# Patient Record
Sex: Female | Born: 1935 | Race: White | Hispanic: No
Health system: Southern US, Community
[De-identification: ages and names within clinical notes are randomized; demographics above are authoritative.]

---

## 1998-02-14 ENCOUNTER — Encounter: Admission: RE | Admit: 1998-02-14 | Discharge: 1998-05-15 | Payer: Self-pay | Admitting: Internal Medicine

## 1998-03-31 ENCOUNTER — Ambulatory Visit (HOSPITAL_COMMUNITY): Admission: RE | Admit: 1998-03-31 | Discharge: 1998-03-31 | Payer: Self-pay | Admitting: *Deleted

## 1999-09-11 ENCOUNTER — Encounter: Admission: RE | Admit: 1999-09-11 | Discharge: 1999-09-11 | Payer: Self-pay | Admitting: *Deleted

## 2000-09-15 ENCOUNTER — Encounter: Admission: RE | Admit: 2000-09-15 | Discharge: 2000-09-15 | Payer: Self-pay | Admitting: *Deleted

## 2000-09-29 ENCOUNTER — Encounter: Payer: Self-pay | Admitting: *Deleted

## 2000-09-29 ENCOUNTER — Ambulatory Visit (HOSPITAL_COMMUNITY): Admission: RE | Admit: 2000-09-29 | Discharge: 2000-09-29 | Payer: Self-pay | Admitting: *Deleted

## 2000-10-17 ENCOUNTER — Encounter: Payer: Self-pay | Admitting: *Deleted

## 2000-10-17 ENCOUNTER — Ambulatory Visit (HOSPITAL_COMMUNITY): Admission: RE | Admit: 2000-10-17 | Discharge: 2000-10-17 | Payer: Self-pay | Admitting: *Deleted

## 2000-10-17 ENCOUNTER — Encounter (INDEPENDENT_AMBULATORY_CARE_PROVIDER_SITE_OTHER): Payer: Self-pay

## 2001-01-19 ENCOUNTER — Other Ambulatory Visit: Admission: RE | Admit: 2001-01-19 | Discharge: 2001-01-19 | Payer: Self-pay

## 2001-08-28 ENCOUNTER — Ambulatory Visit (HOSPITAL_COMMUNITY): Admission: RE | Admit: 2001-08-28 | Discharge: 2001-08-28 | Payer: Self-pay | Admitting: *Deleted

## 2001-08-28 ENCOUNTER — Encounter (INDEPENDENT_AMBULATORY_CARE_PROVIDER_SITE_OTHER): Payer: Self-pay | Admitting: Specialist

## 2001-10-16 ENCOUNTER — Encounter: Payer: Self-pay | Admitting: Emergency Medicine

## 2001-10-16 ENCOUNTER — Inpatient Hospital Stay (HOSPITAL_COMMUNITY): Admission: EM | Admit: 2001-10-16 | Discharge: 2001-11-09 | Payer: Self-pay | Admitting: Emergency Medicine

## 2001-10-20 ENCOUNTER — Encounter: Payer: Self-pay | Admitting: Orthopedic Surgery

## 2001-10-21 ENCOUNTER — Encounter: Payer: Self-pay | Admitting: Critical Care Medicine

## 2001-10-22 ENCOUNTER — Encounter: Payer: Self-pay | Admitting: Pulmonary Disease

## 2001-10-23 ENCOUNTER — Encounter: Payer: Self-pay | Admitting: Pulmonary Disease

## 2001-10-27 ENCOUNTER — Encounter: Payer: Self-pay | Admitting: Orthopedic Surgery

## 2001-11-09 ENCOUNTER — Inpatient Hospital Stay
Admission: RE | Admit: 2001-11-09 | Discharge: 2001-12-02 | Payer: Self-pay | Admitting: Physical Medicine & Rehabilitation

## 2002-05-12 ENCOUNTER — Inpatient Hospital Stay (HOSPITAL_COMMUNITY): Admission: RE | Admit: 2002-05-12 | Discharge: 2002-05-17 | Payer: Self-pay | Admitting: Orthopaedic Surgery

## 2002-05-17 ENCOUNTER — Inpatient Hospital Stay (HOSPITAL_COMMUNITY)
Admission: RE | Admit: 2002-05-17 | Discharge: 2002-05-24 | Payer: Self-pay | Admitting: Physical Medicine & Rehabilitation

## 2002-09-10 ENCOUNTER — Inpatient Hospital Stay (HOSPITAL_COMMUNITY): Admission: RE | Admit: 2002-09-10 | Discharge: 2002-09-14 | Payer: Self-pay | Admitting: Orthopaedic Surgery

## 2003-05-06 ENCOUNTER — Encounter: Payer: Self-pay | Admitting: Orthopedic Surgery

## 2003-05-06 ENCOUNTER — Inpatient Hospital Stay (HOSPITAL_COMMUNITY): Admission: AD | Admit: 2003-05-06 | Discharge: 2003-05-10 | Payer: Self-pay | Admitting: Orthopedic Surgery

## 2003-05-07 ENCOUNTER — Encounter: Payer: Self-pay | Admitting: Orthopedic Surgery

## 2003-05-10 ENCOUNTER — Inpatient Hospital Stay
Admission: RE | Admit: 2003-05-10 | Discharge: 2003-05-18 | Payer: Self-pay | Admitting: Physical Medicine & Rehabilitation

## 2003-05-18 ENCOUNTER — Encounter: Payer: Self-pay | Admitting: Physical Medicine & Rehabilitation

## 2003-05-18 ENCOUNTER — Ambulatory Visit (HOSPITAL_COMMUNITY)
Admission: RE | Admit: 2003-05-18 | Discharge: 2003-05-18 | Payer: Self-pay | Admitting: Physical Medicine & Rehabilitation

## 2004-01-06 ENCOUNTER — Encounter (INDEPENDENT_AMBULATORY_CARE_PROVIDER_SITE_OTHER): Payer: Self-pay | Admitting: Specialist

## 2004-01-06 ENCOUNTER — Ambulatory Visit (HOSPITAL_COMMUNITY): Admission: RE | Admit: 2004-01-06 | Discharge: 2004-01-06 | Payer: Self-pay | Admitting: *Deleted

## 2004-03-08 ENCOUNTER — Ambulatory Visit (HOSPITAL_COMMUNITY): Admission: RE | Admit: 2004-03-08 | Discharge: 2004-03-09 | Payer: Self-pay | Admitting: *Deleted

## 2004-06-19 ENCOUNTER — Inpatient Hospital Stay (HOSPITAL_COMMUNITY): Admission: RE | Admit: 2004-06-19 | Discharge: 2004-06-22 | Payer: Self-pay | Admitting: Orthopedic Surgery

## 2004-06-22 ENCOUNTER — Ambulatory Visit: Payer: Self-pay | Admitting: Physical Medicine & Rehabilitation

## 2004-06-22 ENCOUNTER — Inpatient Hospital Stay
Admission: RE | Admit: 2004-06-22 | Discharge: 2004-06-29 | Payer: Self-pay | Admitting: Physical Medicine & Rehabilitation

## 2004-09-04 ENCOUNTER — Encounter: Admission: RE | Admit: 2004-09-04 | Discharge: 2004-12-03 | Payer: Self-pay | Admitting: Orthopedic Surgery

## 2004-11-30 ENCOUNTER — Encounter (INDEPENDENT_AMBULATORY_CARE_PROVIDER_SITE_OTHER): Payer: Self-pay | Admitting: Specialist

## 2004-11-30 ENCOUNTER — Ambulatory Visit (HOSPITAL_COMMUNITY): Admission: RE | Admit: 2004-11-30 | Discharge: 2004-11-30 | Payer: Self-pay | Admitting: *Deleted

## 2005-02-23 ENCOUNTER — Emergency Department (HOSPITAL_COMMUNITY): Admission: EM | Admit: 2005-02-23 | Discharge: 2005-02-23 | Payer: Self-pay | Admitting: Emergency Medicine

## 2005-03-07 ENCOUNTER — Encounter: Admission: RE | Admit: 2005-03-07 | Discharge: 2005-03-07 | Payer: Self-pay | Admitting: Orthopaedic Surgery

## 2005-03-08 ENCOUNTER — Encounter: Admission: RE | Admit: 2005-03-08 | Discharge: 2005-03-08 | Payer: Self-pay | Admitting: Radiology

## 2005-03-11 ENCOUNTER — Encounter: Admission: RE | Admit: 2005-03-11 | Discharge: 2005-03-11 | Payer: Self-pay | Admitting: Orthopaedic Surgery

## 2005-06-20 ENCOUNTER — Encounter: Admission: RE | Admit: 2005-06-20 | Discharge: 2005-06-20 | Payer: Self-pay | Admitting: Orthopaedic Surgery

## 2005-06-27 ENCOUNTER — Encounter: Admission: RE | Admit: 2005-06-27 | Discharge: 2005-06-27 | Payer: Self-pay | Admitting: Orthopaedic Surgery

## 2005-07-12 ENCOUNTER — Encounter: Admission: RE | Admit: 2005-07-12 | Discharge: 2005-07-12 | Payer: Self-pay | Admitting: Orthopaedic Surgery

## 2005-07-30 ENCOUNTER — Encounter: Admission: RE | Admit: 2005-07-30 | Discharge: 2005-07-30 | Payer: Self-pay | Admitting: *Deleted

## 2005-09-13 ENCOUNTER — Inpatient Hospital Stay (HOSPITAL_COMMUNITY): Admission: RE | Admit: 2005-09-13 | Discharge: 2005-09-15 | Payer: Self-pay | Admitting: Orthopaedic Surgery

## 2005-11-05 ENCOUNTER — Ambulatory Visit (HOSPITAL_COMMUNITY): Admission: RE | Admit: 2005-11-05 | Discharge: 2005-11-05 | Payer: Self-pay | Admitting: *Deleted

## 2005-12-11 ENCOUNTER — Encounter (INDEPENDENT_AMBULATORY_CARE_PROVIDER_SITE_OTHER): Payer: Self-pay | Admitting: *Deleted

## 2005-12-11 ENCOUNTER — Ambulatory Visit (HOSPITAL_COMMUNITY): Admission: RE | Admit: 2005-12-11 | Discharge: 2005-12-11 | Payer: Self-pay | Admitting: *Deleted

## 2007-04-11 ENCOUNTER — Emergency Department (HOSPITAL_COMMUNITY): Admission: EM | Admit: 2007-04-11 | Discharge: 2007-04-11 | Payer: Self-pay | Admitting: Emergency Medicine

## 2008-02-23 ENCOUNTER — Ambulatory Visit (HOSPITAL_COMMUNITY): Admission: RE | Admit: 2008-02-23 | Discharge: 2008-02-23 | Payer: Self-pay | Admitting: Orthopaedic Surgery

## 2008-02-23 ENCOUNTER — Encounter (INDEPENDENT_AMBULATORY_CARE_PROVIDER_SITE_OTHER): Payer: Self-pay | Admitting: Orthopaedic Surgery

## 2008-02-23 ENCOUNTER — Ambulatory Visit: Payer: Self-pay | Admitting: Vascular Surgery

## 2008-05-26 ENCOUNTER — Inpatient Hospital Stay (HOSPITAL_COMMUNITY): Admission: EM | Admit: 2008-05-26 | Discharge: 2008-05-28 | Payer: Self-pay | Admitting: Emergency Medicine

## 2008-05-27 ENCOUNTER — Ambulatory Visit: Payer: Self-pay | Admitting: Vascular Surgery

## 2008-05-27 ENCOUNTER — Encounter (INDEPENDENT_AMBULATORY_CARE_PROVIDER_SITE_OTHER): Payer: Self-pay | Admitting: *Deleted

## 2008-05-30 ENCOUNTER — Encounter: Admission: RE | Admit: 2008-05-30 | Discharge: 2008-08-28 | Payer: Self-pay | Admitting: Internal Medicine

## 2008-06-13 ENCOUNTER — Inpatient Hospital Stay (HOSPITAL_COMMUNITY): Admission: EM | Admit: 2008-06-13 | Discharge: 2008-06-17 | Payer: Self-pay | Admitting: Emergency Medicine

## 2008-08-29 ENCOUNTER — Encounter: Admission: RE | Admit: 2008-08-29 | Discharge: 2008-10-25 | Payer: Self-pay | Admitting: Internal Medicine

## 2009-10-12 ENCOUNTER — Inpatient Hospital Stay (HOSPITAL_COMMUNITY): Admission: EM | Admit: 2009-10-12 | Discharge: 2009-10-15 | Payer: Self-pay | Admitting: Emergency Medicine

## 2010-02-14 ENCOUNTER — Encounter: Admission: RE | Admit: 2010-02-14 | Discharge: 2010-02-14 | Payer: Self-pay | Admitting: Neurology

## 2010-11-18 ENCOUNTER — Encounter: Payer: Self-pay | Admitting: Orthopaedic Surgery

## 2011-01-28 LAB — HEPATIC FUNCTION PANEL
ALT: 15 U/L (ref 0–35)
AST: 16 U/L (ref 0–37)
Alkaline Phosphatase: 65 U/L (ref 39–117)
Bilirubin, Direct: 0.1 mg/dL (ref 0.0–0.3)
Indirect Bilirubin: 0.6 mg/dL (ref 0.3–0.9)
Total Protein: 6 g/dL (ref 6.0–8.3)

## 2011-01-28 LAB — CBC
HCT: 42.1 % (ref 36.0–46.0)
HCT: 43 % (ref 36.0–46.0)
MCHC: 34.5 g/dL (ref 30.0–36.0)
MCV: 98.7 fL (ref 78.0–100.0)
MCV: 99.7 fL (ref 78.0–100.0)
Platelets: 148 10*3/uL — ABNORMAL LOW (ref 150–400)
Platelets: 152 10*3/uL (ref 150–400)
RBC: 4.22 MIL/uL (ref 3.87–5.11)
RDW: 13.1 % (ref 11.5–15.5)
WBC: 8.8 10*3/uL (ref 4.0–10.5)

## 2011-01-28 LAB — GLUCOSE, CAPILLARY
Glucose-Capillary: 125 mg/dL — ABNORMAL HIGH (ref 70–99)
Glucose-Capillary: 131 mg/dL — ABNORMAL HIGH (ref 70–99)
Glucose-Capillary: 158 mg/dL — ABNORMAL HIGH (ref 70–99)
Glucose-Capillary: 82 mg/dL (ref 70–99)
Glucose-Capillary: 86 mg/dL (ref 70–99)
Glucose-Capillary: 94 mg/dL (ref 70–99)

## 2011-01-28 LAB — BASIC METABOLIC PANEL
BUN: 6 mg/dL (ref 6–23)
CO2: 28 mEq/L (ref 19–32)
CO2: 29 mEq/L (ref 19–32)
CO2: 29 mEq/L (ref 19–32)
Calcium: 8.2 mg/dL — ABNORMAL LOW (ref 8.4–10.5)
Calcium: 8.4 mg/dL (ref 8.4–10.5)
Chloride: 104 mEq/L (ref 96–112)
Chloride: 104 mEq/L (ref 96–112)
Creatinine, Ser: 0.79 mg/dL (ref 0.4–1.2)
Creatinine, Ser: 0.86 mg/dL (ref 0.4–1.2)
GFR calc Af Amer: >60 mL/min (ref >60)
Glucose, Bld: 82 mg/dL (ref 70–99)
Sodium: 141 mEq/L (ref 135–145)

## 2011-01-28 LAB — LIPID PANEL
HDL: 46 mg/dL (ref >39)
Triglycerides: 115 mg/dL (ref <150)
VLDL: 23 mg/dL (ref 0–40)

## 2011-01-28 LAB — FECAL LACTOFERRIN, QUANT: Fecal Lactoferrin: NEGATIVE

## 2011-01-28 LAB — LACTIC ACID, PLASMA: Lactic Acid, Venous: 0.8 mmol/L (ref 0.5–2.2)

## 2011-01-28 LAB — BRAIN NATRIURETIC PEPTIDE: Pro B Natriuretic peptide (BNP): 405 pg/mL — ABNORMAL HIGH (ref 0.0–100.0)

## 2011-01-29 LAB — COMPREHENSIVE METABOLIC PANEL
CO2: 28 mEq/L (ref 19–32)
Calcium: 8.9 mg/dL (ref 8.4–10.5)
Creatinine, Ser: 0.69 mg/dL (ref 0.4–1.2)
GFR calc non Af Amer: >60 mL/min (ref >60)
Glucose, Bld: 225 mg/dL — ABNORMAL HIGH (ref 70–99)

## 2011-01-29 LAB — POCT CARDIAC MARKERS: Troponin i, poc: <0.05 ng/mL (ref 0.00–0.09)

## 2011-01-29 LAB — LIPASE, BLOOD: Lipase: 11 U/L (ref 11–59)

## 2011-01-29 LAB — URINE MICROSCOPIC-ADD ON

## 2011-01-29 LAB — URINALYSIS, ROUTINE W REFLEX MICROSCOPIC
Bilirubin Urine: NEGATIVE
Glucose, UA: 100 mg/dL — AB
Hgb urine dipstick: NEGATIVE
Specific Gravity, Urine: 1.017 (ref 1.005–1.030)
Urobilinogen, UA: 1 mg/dL (ref 0.0–1.0)

## 2011-01-29 LAB — DIFFERENTIAL
Eosinophils Absolute: 0 10*3/uL (ref 0.0–0.7)
Lymphocytes Relative: 7 % — ABNORMAL LOW (ref 12–46)
Lymphs Abs: 0.6 10*3/uL — ABNORMAL LOW (ref 0.7–4.0)
Neutrophils Relative %: 91 % — ABNORMAL HIGH (ref 43–77)

## 2011-01-29 LAB — URINE CULTURE

## 2011-01-29 LAB — HEMOCCULT GUIAC POC 1CARD (OFFICE): Fecal Occult Bld: NEGATIVE

## 2011-01-29 LAB — CBC
Hemoglobin: 17 g/dL — ABNORMAL HIGH (ref 12.0–15.0)
MCHC: 35 g/dL (ref 30.0–36.0)
MCV: 97.2 fL (ref 78.0–100.0)
RBC: 5.01 MIL/uL (ref 3.87–5.11)

## 2011-01-29 LAB — APTT: aPTT: 23 seconds — ABNORMAL LOW (ref 24–37)

## 2011-01-29 LAB — PROTIME-INR: Prothrombin Time: 13.4 seconds (ref 11.6–15.2)

## 2011-03-12 NOTE — Discharge Summary (Signed)
[name], [name] NO.:  [id number]   MEDICAL RECORD NO.:  [id number]          Phillips TYPE:  INP   LOCATION:  4728                         FACILITY:  [facility]   PHYSICIAN:  Theodosia Paling, MD    DATE OF BIRTH:  02-Apr-1920   DATE OF ADMISSION:  05/26/2008  DATE OF DISCHARGE:  05/28/2008                               DISCHARGE SUMMARY   PRIMARY. CARE PHYSICIAN:  Georgianne Fick, M.D.   CARDIOLOGIST:  Madaline Savage, M.D.   ADMITTING HISTORY:  [name] is an 77 year old Caucasian lady who  resides at [facility] home independently.  [name] got multiple medical problems.  [name] accompanied her in [facility] after having been transferred from  [name] office for dizziness.  [name] was in her usual  state of health until a few days ago when [name] started experiencing  dizziness and [name] described a spinning sensation.  [name] said [name] has to  hold onto something to get steady.  [name] had associated nausea and  vomiting, but no diarrhea or abdominal pain.  No fever.  [name]  denies any recent upper respiratory tract infection.  [name] denies  rhinorrhea, otorrhea, or increased hearing loss and no tinnitus.  [name]  had several episodes of fall.  [name] bumps her head against her  dressers  and while [name] has been ambulating with a walker recently.  Her [name]  thinks [name] got confused and had some facial droop, which [name] has  resolved according to doctor's account, but there was associated slurred  speech.  Speech has not completely returned back to normal.  There is no  focal weakness.  [name] is oriented to time, place, and person.  [name] has no associated headache.  Head CT shows no acute abnormality.   HOSPITAL COURSE:  Following issues were addressed during her hospital  stay:  1. Vertigo.  [name] underwent a telemetry evaluation, which did      not show any arrhythmia.  [name] did not have any signs of cerebellar      or middle ear symptoms as well.  [name]  was extremely dehydrated.  [name]      felt better once meclizine was started and [name] received fluids.      Carotid duplex, however, showed right-sided 60%-80% ICA stenosis      and more than 80% left-sided ICA stenosis.  A CT scan with IV      contrast was also done to rule out cerebellar stroke, which is      essentially negative.  [name]'s vertigo improved after getting      IV hydration and meclizine.  Physical Therapy recommended Bonnie      Phillips to use wheeled walker, which [name] will be using when      [name] is at home.  Fall precautions were also taught to [name].  2. Carotid stenosis.  Carotid duplex as mentioned above showed      stenosis.  [name] needs vascular surgeon to evaluate her as an      outpatient.  [name] wants to  see cardiologist before [name] sees      Bonnie vascular surgeon.  3. Dysphagia.  [name] has been having dysphagia for several      years, which is secondary to esophageal dysmotility.  Speech      pathologist saw and gave [name] recommendation for aspiration      precaution.  [name] may benefit from an outpatient GI consult.  [name]      was told to see [name] for above issues.   DISCHARGE DIAGNOSES:  1. Vertigo.  2. Carotid stenosis.  3. Dysphagia.   DISCHARGE MEDICATIONS:  [name] is to continue her home medications.  In addition, [name] is going home on meclizine 12.5 mg p.o. q.6 h.  for dizziness or vertigo.  [name] is also getting meclizine 12.5 mg  p.o. q.8 h.   HOME MEDICATIONS:  1. Salsalate 750 mg p.o. daily.  2. Aspirin enteric coated 81 mg p.o. daily.  3. Protonix 40 mg p.o. daily.  4. Atenolol 25 mg p.o. daily.  5. Simvastatin 10 mg p.o. at bedtime.  6. Amitriptyline 25 mg p.o. daily.  7. Sinemet 25/100 mg p.o. daily.  8. Sildenafil 20 mg p.o. daily.  9. Pramipexole 1.5 mg p.o. at bedtime.  10.Ativan 1 mg p.o. at bedtime p.r.n.  11.Prevacid.   Total time spent in discharge of this Phillips  is 45 minutes.  Imaging  result include CTA, which was essentially negative, carotid stenosis as  also mentioned in hospital course.      Theodosia Paling, MD  Electronically Signed     NP/MEDQ  D:  05/28/2008  T:  05/29/2008  Job:  [id number]   cc:   Madaline Savage, M.D.  Georgianne Fick, M.D.

## 2011-03-12 NOTE — Discharge Summary (Signed)
[name], [name] NO.:  [id number]   MEDICAL RECORD NO.:  [id number]          PATIENT TYPE:  INP   LOCATION:  2001                         FACILITY:  [facility]   PHYSICIAN:  Beckey Rutter, MD  DATE OF BIRTH:  1920/01/13   DATE OF ADMISSION:  06/13/2008  DATE OF DISCHARGE:  06/17/2008                               DISCHARGE SUMMARY   CARDIOLOGIST:  Madaline Savage, MD   CHIEF COMPLAINT ON PRESENTATION:  Recurrent fall and syncope.   HOSPITAL COURSE:  1. Syncope.  This is felt multifactorial secondary to the      anticholinergic side effect contributed by the amitriptyline and      Ativan in the background of Parkinson features.  The patient also      was found to have internal carotid artery stenosis which could be      contributory to the finding.  The patient is stable during the      hospital stay and now she is stable to be released home.  It is      strongly recommended to her to follow up with Neurology within a      week to further evaluate the anti-Parkinsonian medication as well      as the Ativan.  The amitriptyline will be held until the patient      sees a neurologist.  2. Internal carotid artery stenosis.  The patient was seen by Vascular      Surgery, Dr. Arbie Cookey, who felt that the patient might need further      evaluation and consideration for left carotid endarterectomy if the      symptoms persist.  The plan now is to follow up with Dr. Arbie Cookey      within a week or two.   HOSPITAL CONSULTATION:  1. Neurology Service done by Dr. Levert Feinstein.  2. Vascular consultation, Dr. Gretta Began.   PROCEDURES:  EEG on June 14, 2008, the impression was showing normal  awake EEG.  There is no evidence of epileptiform discharge.  The patient  had a CT head without contrast on June 13, 2008, showing no acute  intracranial findings, sphenoid sinusitis as discussed above.   DISCHARGE DIAGNOSES:  1. Syncope/frequent falls.  This is multifactorial as  discussed above.      The patient should continue on anti-Parkinson medication and follow      up with Neurology and Vascular Surgery.  2. Left carotid artery stenosis.  The patient might benefit from      carotid endarterectomy.  Followup visit was scheduled with Dr.      Arbie Cookey in the next 1-2 weeks.   SECONDARY DIAGNOSES:  1. Coronary artery disease.  2. Hyperlipidemia.  3. Diabetes.  4. Carotid artery stenosis.  5. Gastroesophageal reflux disease.  6. Hiatal hernia.  7. Rheumatoid arthritis.  8. Thoracic compression fracture status post pacemaker insertion and      esophageal dysmotility.  9. Restless leg syndrome.  10.Hypertension.  11.Questionable dementia.   DISCHARGE MEDICATIONS:  1. Aspirin 325 mg daily.  2. Atenolol 25 mg in the morning.  3. Calcium plus vitamin D in the morning.  4. One capsule of fish oil every morning.  5. Stool softer p.r.n.  6. Carbidopa/levodopa 25/100 b.i.d.  7. Viagra half tablet of 100 mg.  8. Zocor 10 mg daily.  9. Mirapex 1.5 mg nightly.  10.Lorazepam 2 mg p.o. at night.   DISCHARGE PLAN:  As discussed above, the patient was found to have  carotid artery stenosis 60-80% on the right and 80% on the left.  The  plan is to follow up with Dr. Tawanna Cooler Early for assessment and evaluation  for endarterectomy.  The patient also had some symptoms of Parkinson  disease with anticholinergic medication.  As noted above, the  amitriptyline medication was on hold pending outpatient neurology  evaluation for the anti-Parkinsonian symptoms.  The patient is taking  levodopa and carbidopa currently, but she is taking this medicine for  restless leg syndrome.  The patient and daughter is aware and agreeable  to the discharge plan.  Phone number for Dr. Arbie Cookey and Dr. Terrace Arabia is  provided.       Beckey Rutter, MD  Electronically Signed     EME/MEDQ  D:  06/17/2008  T:  06/18/2008  Job:  [id number]

## 2011-03-12 NOTE — H&P (Signed)
NAMEMAZEY, MANTELL NO.:  [id number]   MEDICAL RECORD NO.:  [id number]          PATIENT TYPE:  EMS   LOCATION:  MAJO                         FACILITY:  [facility]   PHYSICIAN:  Theodosia Paling, MD    DATE OF BIRTH:  06-16-1920   DATE OF ADMISSION:  06/13/2008  DATE OF DISCHARGE:                              HISTORY & PHYSICAL   PRIMARY CARE PHYSICIAN:  Georgianne Fick, M.D.   CARDIOLOGIST:  Madaline Savage, M.D.   CHIEF COMPLAINT:  Recurrent falls and syncope.   HISTORY OF PRESENT ILLNESS:  [name] is a very pleasant 77 year old  Caucasian lady who was recently admitted to the hospital through May 26, 2008 until May 28, 2008, where she was admitted with syncope and  the workup during that time revealed right-sided internal carotid artery  stenosis, 60-80% and more than 80% left-sided ICA stenosis.  CT scan  with IV contrast was done to rule out cerebellar strokes, which is  essentially negative.  MRA could not be done, as the patient has a  pacemaker.  Telemetry at that time was negative.  Echocardiogram did not  show any ejection fraction, aortic stenosis, or outflow tract  obstruction.  Patient was sent home for outpatient vascular workup, and  since patient has gone home, apparently patient has been falling every  night.  Yesterday morning at around 10:00, patient sustained a fall.  Patient tried to contact Dr. Elsie Lincoln, who is the primary cardiologist for  the patient, whose office recommended for the patient to present to the  emergency room for further evaluation and management.  Patient  essentially is not able to give a very clear history of the syncope  causing these episodes or visual disturbances.  She attributes most of  these falls to lack of balance.  She has been getting physical therapy  for that.  A CT scan done in the emergency room did not show any acute  intracranial pathology.  Most of the blood work was essentially  negative.  The  hospitalist service was contacted for further evaluation  and management.   PAST MEDICAL HISTORY:  1. Coronary artery disease.  2. Hyperlipidemia.  3. NIDDM.  4. Carotid artery stenosis, recently diagnosed.  5. GERD.  6. Hiatal hernia.  7. Rheumatoid arthritis.  8. Osteoarthritis.  9. Thoracic compression fracture.  10.Status post pacemaker insertion.  11.History of ataxia.  12.Esophageal dysmotility.  13.Restless legs syndrome.  14.Hypertension.  15.Dementia.  16.Myocardial infarction.   HOME MEDICATIONS:  Patient was recently discharged on the following  medications:  1. Salicylate 750 mg p.o. daily.  2. Aspirin, enteric coated, 81 mg p.o. daily.  3. Protonix 40 mg p.o. daily.  4. Atenolol 25 mg p.o. daily.  5. Simvastatin 10 mg p.o. daily.  6. Amitriptyline 25 mg p.o. daily.  7. Sinemet 25/100 p.o. daily.  8. Lisinopril 20 mg p.o. daily.  9. Ambien 1.5 mg p.o. nightly.  10.Ativan 1 mg p.o. nightly p.r.n.  11.Meclizine 12.5 mg p.o. q.8h. p.r.n. and  p.o. q.8h. regimen.   PAST SURGICAL HISTORY:  1. Back surgery.  2. Hip replacement.  3. Status post stent placement.  4. Vertebroplasty with shoulder replacement.   ALLERGIES:  CODEINE.   FAMILY HISTORY:  Noncontributory.   SOCIAL HISTORY:  Patient lives alone at home.  Her daughter lives across  the street.  She uses a walker to ambulate.  She seldom gets out.  She  has somebody who comes in to help her Monday through Friday.  Twice a  day, she still smokes cigarettes.  Has been smoking for 60 years, drinks  two glasses of wine a day.   REVIEW OF SYSTEMS:  CONSTITUTIONAL:  Patient denies fever, fatigue,  weight loss, weight gain.  HEENT:  Denies blurred vision, ear or nose  discharge, also throat.  LUNGS:  Denies productive cough, hemoptysis,  pleuritic pain.  CARDIOVASCULAR:  Denies PND, orthopnea, anasarca,  palpitations.  GI:  Denies nausea, vomiting, diarrhea, hematochezia.  GENITOURINARY:  Denies any dysuria  or hematuria.  EXTREMITIES:  Denies  pedal edema.  SKIN:  Denies any skin rash.  MUSCULOSKELETAL:  Patient  has a history of rheumatoid arthritis.  Denies any exacerbation of  myositis.  CNS:  Denies any focal neurologic deficits.  She has problems  with her balance.  She has been using physical therapy for that.  PSYCH:  Denies any suicidal ideation or depression.   PHYSICAL EXAMINATION:  VITAL SIGNS:  Temperature 98.4, blood pressure  110/70, heart rate 74, respiratory rate 20, oxygen saturation 99% on  room air.  GENERAL:  No acute cardiac distress.  HEENT:  Pupils are equal and reactive to light.  Extraocular movements  are intact.  LUNGS:  Normal breath sounds.  No rhonchi, crepitations.  CARDIOVASCULAR:  S1 and S2 normal.  No murmur, rub or gallop heard.  Carotid bruits present bilaterally.  ABDOMEN:  Soft.  Nondistended.  No organomegaly.  Bowel sounds present.  EXTREMITIES:  No pedal edema.  PELVIC/BREASTS:  Deferred.  SKIN:  Intact, warm and dry.  PSYCH:  Alert and oriented x3.  CNS:  No focal neurological deficits.  Cranial nerves intact.  Motor/sensory intact.  Gait not tested.   LAB DATA:  WBC 4.8, hemoglobin 13.4, hematocrit 39.7, platelet count  122.  Electrolytes:  Sodium 145, potassium 3.6, chloride 111,  bicarbonate 27, glucose 107, BUN 18, creatinine 0.8, AST 26, ALT 21,  total protein 6.1, albumin 2.4, calcium 8.4.  Cardiac enzymes first set  negative.  Urinalysis:  Negative WBCs, negative leukocyte esterase.  Urine culture pending.  Ionized calcium 1.00.   ASSESSMENT/PLAN:  1. Syncopal/frequent falls:  Patient's frequent falls could be the result of recently prescribed  meclizine as it could  be causing some anticholinergic side effects.  Could also be contributed by amitriptyline and Ativan that she takes at  bedtime.  It could be due to hypoperfusion from her carotid artery  stenosis, especially in the posterior circulation.  Cerebellar pathology  is highly  unlikely.  Given her obvious carotid artery stenosis, I am  going to involve vascular in her care.  I also will involve neurology  and see if they have anything else to contribute, and EEG to rule out  seizure activity will be performed tomorrow morning.  1. Physical therapy will be continued.  Avoid any significant      sedatives or anything that interferes with her balance and      mentation.  2. Dysphagia:  Patient has a history of dysphagia.  Speech pathology      has evaluated the patient in the  past.  We will continue her direct      mentation.  3. Coronary artery disease:  Stable at this time.  Patient has no      anginal symptoms.  4. Restless legs syndrome:  Continue Mirapex and carbidopa.  5. Code status:  Patient is DNR.      Theodosia Paling, MD  Electronically Signed     NP/MEDQ  D:  06/13/2008  T:  06/13/2008  Job:  [id number]   cc:   Madaline Savage, M.D.  Lemmie Evens, M.D.

## 2011-03-12 NOTE — H&P (Signed)
[name], [name]                ACCOUNT NO.:  [id number]   MEDICAL RECORD NO.:  [id number]          PATIENT TYPE:  INP   LOCATION:  1844                         FACILITY:  [facility]   PHYSICIAN:  Mobolaji B. Bakare, M.D.DATE OF BIRTH:  04-29-1920   DATE OF ADMISSION:  05/26/2008  DATE OF DISCHARGE:                              HISTORY & PHYSICAL   CARDIOLOGIST:  Dr. Elsie Lincoln.   RHEUMATOLOGIST:  Dr. Jimmy Footman.   PRIMARY CARE PHYSICIAN:  Dr. Nicholos Johns.   CHIEF COMPLAINT:  Confusion, dizziness and fall.   HISTORY OF PRESENTING COMPLAINT:  [name] is an 77 year old Caucasian  female who resides at home independently.  She has got multiple medical  problems as listed below.  Daughter accompanied her to the emergency  room after having been transferred from Dr. Carolyn Stare office for  chief complaint of dizziness.  The patient was in her usual state of  health until about 3 days ago when she started experiencing dizziness  which she describes as a spinning sensation such that she has to hold  onto something to get steady.  She has associated nausea and vomiting  but no diarrhea or abdominal pain.  No fever.  The patient denies any  recent upper respiratory tract infection.  She denies rhinorrhea,  nocturia or increased hearing loss.  No tinnitus.  She has had several  episodes of fall such that she bumps her head against dressers and wall.  She had been ambulating with a walker fairly recently.  Her daughter  thinks that she got confused yesterday and had some facial droop which  she has resolved according to daughter's account, but there was  associated slurred speech.  Speech has not completely returned back to  normal.  There is no focal weakness.  The patient is oriented in time,  place and person.  She has no associated headaches.  Head CT scan shows  no acute abnormality.   REVIEW OF SYSTEMS:  The patient has had increasing memory impairment in  the last few weeks such  that she cannot dial telephone numbers.  She  could not remember things.  She feels her eyes are dimming but no acute  visual loss.  She denies diarrhea, abdominal pain.  She has dysphagia.  Sometimes she chokes with food.  There is no fever, no dysuria, urgency  or foul-smelling urine.   PAST MEDICAL HISTORY:  1. Coronary artery disease.  2. Hyperlipidemia.  3. Non-insulin dependent diabetes mellitus.  4. Gastroesophageal reflux disease.  5. Hiatal hernia.  6. Rheumatoid arthritis.  7. Osteoarthritis.  8. Thoracic compression fracture.  9. Status post pacemaker insertion.  10.History of ataxia.  11.Esophageal dysmotility.  12.Restless leg syndrome.  13.Hypertension.  14.Dementia.  15.Myocardial infarction.  16.Bilateral carotid artery stenosis followed by Dr. Elsie Lincoln.   PAST SURGICAL HISTORY:  1. Back surgery.  2. Hip replacement.  3. Status post stent placement.  4. Vertebroplasty for shoulder replacement.   CURRENT MEDICATIONS:  1. Aspirin 81 mg daily.  2. Atenolol 25 mg daily.  3. Amitriptyline 50 mg daily.  4. Actos 15 mg daily.  5.  Lorazepam 2 mg every night.  6. Simvastatin 10 mg every night.  7. Fish oil 1200 daily.  8. Stool softener.  9. Viagra 50 mg daily which she uses for gastric spastic esophagus.  10.Carbidopa levodopa 25/100 two tablets once a day, which she uses      for restless leg syndrome.  11.Mirapex 1.5 mg daily.  12.Caltrate 600 mg daily.  13.Hydrochlorothiazide 25 mg daily.  14.Plaquenil 200 mg twice a day.  15.Protonix 40 mg daily.  16.Potassium chloride 10 mg daily.   ALLERGIES:  CODEINE.   FAMILY HISTORY:  Noncontributory.   SOCIAL HISTORY:  The patient lives alone at home and has been needing  assistance with basic activities of daily living.  She uses a walker to  ambulate.  She had seldom goes out.  She has somebody who comes in to  help her Monday through Friday.  She is still smokes cigarettes, and she  has been smoking for  about 60 years.  She currently smokes about 5  cigarettes a day.  She drinks alcohol about 2 glasses of wine a day.   INITIAL VITALS:  Temperature 97.2, blood pressure 137/64, pulse of 70,  respiratory rate of 20, O2 saturations 97%.  On examination, the patient is awake, alert, oriented to time, place and  person.  Normocephalic, atraumatic.  Pupils equal, round and reactive to  light.  No elevated JVD.  He has bilateral carotid bruit.  No  thyromegaly.  Mucous membranes very dry.  There is no facial asymmetry.  Extraocular muscle movement intact.  Very minimal nystagmus to the  right.  LUNGS:  Good air entry bilaterally.  No wheeze or rhonchi.  CARDIOVASCULAR SYSTEM:  S1-S2 regular.  No murmur or gallop.  ABDOMEN:  Nondistended, soft, nontender.  Bowel sounds present.  No  palpable organomegaly.  EXTREMITIES:  No pedal edema or calf tenderness.  Dorsalis pedis pulses  2+ bilaterally.  CENTRAL NERVOUS SYSTEM:  There is no focal weakness.  Muscle power 4+/5  in all limbs.  Finger-to-nose testing normal bilaterally.  Cranial  nerves intact.  No facial asymmetry.  Extraocular muscle movement  intact.  Uvula is central without deviation.  Tongue is central.  SKIN:  Multiple bruises on the face and some on the trunk and a few on  the lower extremities.   INITIAL LABORATORY DATA:  Urinalysis shows specific gravity of 1.021,  negative for nitrite and leukocyte esterase.  Total bilirubin 0.6,  alkaline phosphatase 88, ALT less than 80, AST 26, albumin 3.6, total  protein 6.1.  White cell count 5.3, hemoglobin 14.1, hematocrit 41.6,  platelets 147, neutrophils 78%.  Sodium 143, potassium 3.5, chloride  116, BUN 18, creatinine 1.3, glucose 119, calcium 1.  Head CT scan shows  atrophy and microvascular white matter disease.  No acute or focal  intracranial abnormality, some total opacification of the left sphenoid  sinus.  Chest x-ray shows no acute cardiopulmonary disease.   EKG shows  incomplete right bundle branch block, heart rate of 70 beats  per minute,  left anterior fascicular block, no acute ST changes.   ASSESSMENT AND PLAN:  [name] is an 77 year old Caucasian female  presenting with dizziness which she describes as spinning sensation  associated with nausea, vomiting and unsteady gait.  She has falling a  couple of times.  She had a head CT scan which showed no acute  abnormality.  She will be admitted for further evaluation and treatment.   ADMISSION DIAGNOSES:  1.  Vertigo of unclear etiology.  Will start the patient on scheduled      meclizine 12.5 mg p.o. t.i.d. for 72 hours and then q.6h. p.r.n.      Will check orthostatic blood pressure.  There was a question of      slurred speech per the patient's daughter and facial droop.      However, this was noticed 2 weeks ago per her daughter, and head CT      scan is not showing any subacute infarct.  I doubt this is cerebral      vascular accident on the basis of clinical evaluation.      Nevertheless, we may need to repeat a head CT scan if she continues      to have persistent symptoms.  She has a pacemaker making it      impossible to do an MRI.  She does have history of bilateral      carotid stenosis.  She is due for a repeat study in August.  Will      go ahead and do this during this hospitalization.  She will be      evaluated by speech therapist for language and cognition.  2. Nausea and vomiting, likely secondary to vertigo associated with      dehydration.  The patient will be hydrated with half normal saline      at 60 mL per hour for 1 liter then reevaluate.  Will check      abdominal x-ray to rule out ileus.  3. Fall secondary to #1 with multiple bruises on head and trunk.  Head      CT scan is negative for acute abnormality.  There is no open      laceration.  Will offer PT and OT evaluation.  I am reducing      lorazepam and amitriptyline.  These again may be complicating      underlying  problems.  4. Dysphagia likely secondary to esophageal dysmotility.  The patient      may have some oropharyngeal component.  We ask speech pathologist      to evaluate and make dietary recommendations.  Meanwhile, we will      do a bedside swallowing evaluation, and if okay will start      mechanical soft diet with nectar-thick liquids.  5. Tobacco abuse.  Will place the patient on nicotine patch 40 mg      daily and offer tobacco cessation counseling.  6. Diabetes mellitus.  This apparently has been controlled as per the      patient's record.  CBG at home has been between 119-136.  Will      check hemoglobin A1c and resume Actos as before.  7. Coronary artery disease. The patient has no angina symptoms.  Will      continue with beta blocker.  8. Restless leg syndrome.  Will continue Mirapex and carbidopa.  9. Code status.  Discussed with daughter who is the power of attorney.      The patient is DNR.      Mobolaji B. Corky Downs, M.D.  Electronically Signed     MBB/MEDQ  D:  05/26/2008  T:  05/26/2008  Job:  [id number]   cc:   Georgianne Fick, M.D.  Madaline Savage, M.D.  Lemmie Evens, M.D.

## 2011-03-12 NOTE — Procedures (Signed)
HISTORY:  This is an 77 year old female, presenting with confusion,  slurred speech, and frequent falls.   CURRENT MEDICATIONS:  Lovenox, aspirin, Protonix, Tenormin, Zocor,  Elavil, Revatio, Mirapex, Sinemet, Zofran, and Tylenol.   TECHNICAL COMMENT:  An 18-channel EEG was performed based on standard  international 10-20 system.  Upon awakening, the posterior background  activity was well developed, 9 Hz, symmetric, and reactive to eye  opening and closure.  There was no epileptiform discharge recorded.   The patient was drowsy during recording, but no deeper stage of sleep  was achieved.  Photic stimulation with flash frequency of 1-19 Hz was  performed.  There was no abnormality elicited.   Seventeenth channel has dedicated to EKG, which demonstrates normal  sinus rhythm of 60 beats per minute and total recording time was 21  minutes 40 seconds.   In conclusion, this is a normal awake EEG.  There is no evidence of  epileptiform discharge.      Levert Feinstein, MD  Electronically Signed     ZO:XWRU  D:  06/14/2008 16:02:59  T:  06/15/2008 04:38:46  Job #:  [id number]

## 2011-03-12 NOTE — Consult Note (Signed)
[name], LUCKING NO.:  [id number]   MEDICAL RECORD NO.:  [id number]          PATIENT TYPE:  INP   LOCATION:  2001                         FACILITY:  [facility]   PHYSICIAN:  Levert Feinstein, MD          DATE OF BIRTH:  10-13-1920   DATE OF CONSULTATION:  DATE OF DISCHARGE:                                 CONSULTATION   CHIEF COMPLAINT:  Fall.   HISTORY OF PRESENT ILLNESS:  The patient is an 77 year old female, was  admitted to the [facility] Team in June 13, 2008, yesterday, following  a fall accident.  The history is from reviewing the chart, herself, and  her family at the bedside.   The patient has past medical history of rheumatoid arthritis, dementia,  esophageal spasm, has gradually increased gait difficulty, increased for  over the past 2 years, worsened for 2 months.  She had a recent hospital  admission end of July through May 28, 2008, for a dizziness episode  and was found to be dehydrated, telemetry monitoring was normal,  ultrasound of the carotid artery also demonstrated right side 60-80%  stenosis, and the left ICA 80% stenosis.   She woke up in the middle of Saturday night,  trying to use the  bathroom, relying on her walker, but apparently she had unsteady gait,  knocked on the TV stand, which fell over, she felt chest pain, but was  not able to elaborate on the detail whether the TV has fell on her or  not, she sat in the bed, chest pain got worse, she had to get up and sat  on the chair, while her daughter attended her 9 o'clock next morning,  she was sitting in the chair confused, trying to call her daughter who  lives across the street from her and she could not elaborate on the  detail exactly what has happened.  Apparently, she has some right chest  bruits.  She denies new onset vision change, lateralized motor or  sensory deficits.   Daughter reported that the patient over the past 2 years, has gradually  increased gait difficulty,  multiple fall accidents, actually broke her  left shoulder in 2006, and she also had a history of blackout, was  found to have arrhythmia, and underwent pacemaker placement in May 2005.  The patient also reported to visual hallucination, seems swans, and  people in costume, but they were not talking nor bothersome for her  mainly happens at evening time.  She does have orthostatic symptoms,  denies constipation, describes vivid dreams, there was no REM, sleep  disorder, denied loss of smell.  There was no resting tremor but her  gait has changed over the past 2 years become rigid and small and  unsteady.   PAST MEDICAL HISTORY:  1. Coronary artery disease.  2. Hyperlipidemia.  3. Diabetes.  4. Carotid artery stenosis.  5. GERD.  6. Hiatal hernia.  7. Rheumatoid arthritis.  8. Thoracic compression fracture status post pacemaker insertion and      esophageal dysmotility.  9. Restless leg syndrome.  10.Hypertension.  11.Dementia.   HOME MEDICATIONS:  Salicylate, aspirin, Protonix, atenolol, Zocor,  amitriptyline 25 mg p.o. daily, Sinemet 25/100 up to 2 tablets a day for  restless leg syndrome, meclizine, Ativan, Mirapex  1.5 mg p.o. nightly  for restless leg syndrome as well.   SOCIAL HISTORY:  Back surgery, hip replacement, pacemaker placement, and  status post cardiac cath.   ALLERGIES:  CODEINE.   FAMILY HISTORY:  Noncontributory.   SOCIAL HISTORY:  She still lives alone, quit driving following pacemaker  placement in 2005, daughter lives across the street who checks her on  often.  Denies smoke or drink.  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  She still smokes few cigarettes and denies  drinking.   PHYSICAL EXAMINATION:  VITAL SIGNS:  Temperature was 96.5, heart rate is  65, blood pressure 141/62.  CARDIAC:  Regular rate and rhythm.  PULMONARY:  Clear to auscultation bilaterally.  NECK:  Mild-to-moderate rigidity.  No carotid bruits.   NEUROLOGICAL:  She is alert, oriented to place, year, month, but not to  date and the date of the week.  She has 1/3 difficulty with short-term  record.  She could not relate the detail on the event.   Cranial nerves II-XII.  Pupils were equal, round, right fundi were  sharp, visual fields were full on confrontational test, extraocular  movements were full, and she was able to read fine print with glasses.  Facial sensation was normal.  There was mild shallow on left nasolabial  fold.  Head turning and shoulder shrugging were normal and symmetric.  Uvula and tongue midline and also hard of hearing.   Motor examination, mild to moderate rigidity, increased with  reinforcement maneuver, right worse than the left.  The motor strength  is limited on the left upper extremity due to previous shoulder injury,  but strength felt to be normal.  Sensory demonstrates length dependent  decreased light touch, pinprick, vibratory sensation.  Deep tendon  reflex diffusely hypoactive, absent Achilles reflex.  Plantar responses  right side flexor, left side extensor.  Coordination, no dyssynergia,  gait was wide based, unsteady, shuffling, retropulsed instability.  She  also had mild early fatigability with rapid finger tapping, wrist  opening and closure.   CT of the brain without contrast, mild atrophy, and small amounts of  white matter disease.   EEG is normal for her age.  There was no epileptiform discharge.   LABORATORY EVALUATION:  Cardiac panel was negative and CMP demonstrated  low potassium 2.7, previously 3.4 with mild low calcium 8.2.  CBC was  normal.   ASSESSMENT/PLAN:  An 77 year old female, presenting with frequent fall  episode, multifactorial, including  length dependent sensory motor  polyneuropathy, dementia, parkinsonian features, there was also mild  cerebrovascular small ischemic disease.  1. Physical therapy/occupational therapy home health.  2. Increased the Sinemet  25/100 t.i.d.  3. Lab evaluation, thyroid, B12 to rule out treatable cause  4. Follow up with [facility] to 1-2 months post      discharge.      Levert Feinstein, MD  Electronically Signed     YY/MEDQ  D:  06/14/2008  T:  06/15/2008  Job:  [id number]

## 2011-03-12 NOTE — Consult Note (Signed)
NAMETYHESHA, DUTSON NO.:  [id number]   MEDICAL RECORD NO.:  [id number]          PATIENT TYPE:  INP   LOCATION:  2001                         FACILITY:  [facility]   PHYSICIAN:  Larina Earthly, M.D.    DATE OF BIRTH:  Mar 20, 1920   DATE OF CONSULTATION:  DATE OF DISCHARGE:                                 CONSULTATION   HISTORY:  [name] is an 77 year old white female admitted to the  hospital for syncope.  She was recently admitted and discharged from  [facility] for similar problem from May 26, 2008 through  May 28, 2008.  She specifically denies any formal neurologic deficits  with no stroke, transient ischemic attack, aphasia, or amaurosis fugax.  The dizziness can lead to falling.  She reports this can occur on a  daily basis.  She lived independently prior to admission, is becoming  more difficult due to the syncope.  During her last admission, she  underwent carotid duplex on May 27, 2008 and I have reviewed this.  This does reveal severe greater than 80% stenosis in her left internal  carotid artery and 60-80% moderate-to-severe stenosis in her right  internal carotid artery.  Her vertebral artery is antegrade flow  bilaterally.  During the prior admission, she also underwent CT  angiogram of her head which, showed a patent basilar artery with no  major intracranial deficits.   PAST MEDICAL HISTORY:  Significant for coronary artery disease,  undergoing prior coronary artery angioplasty and stenting.  She does  have a history of esophageal spasm and hypertension.   PHYSICAL EXAMINATION:  Well developed white female appearing her stated  age 7, sitting in a chair.  She is grossly intact neurologically.  She  is alert and oriented.  Her pulse status reveals a 2+ radial pulses  bilaterally, 2+ popliteal pulses, and 1-2+ dorsalis pedis pulses  bilaterally.  I reviewed the studies with Ms. Blash and her daughter  and other family present.  I  explained in all likelihood she does have  asymptomatic extracranial cerebrovascular occlusive disease in the  carotid systems bilaterally.  The plan at her last discharge had been  for evaluation by Dr. Elsie Lincoln, her cardiologist first and then to see  Vascular Surgery for further discussion regarding this.  Due to her  readmission, we were consulted for evaluation at this time.  I feel in  all likelihood this is symptomatic.  I did explain to Ms. Brisbane and her  family that she has not had any specific imaging other than ultrasound  of her vertebral arteries, but her intracranial vessels were normal and  she did have antegrade flow on duplex.  She has undergone EEG today and  is undergoing further neurologic evaluation as well.  We will follow  along during this admission to determine the appropriate treatment  regarding her probable asymptomatic x-ray and cerebrovascular occlusive  disease.      Larina Earthly, M.D.  Electronically Signed     TFE/MEDQ  D:  06/14/2008  T:  06/15/2008  Job:  [id number]   cc:  Theodosia Paling, MD

## 2011-03-15 NOTE — Discharge Summary (Signed)
[name], [name]                          ACCOUNT NO.:  [id number]   MEDICAL RECORD NO.:  [id number]                   PATIENT TYPE:  IPS   LOCATION:  4143                                 FACILITY:  [facility]   PHYSICIAN:  Rande Brunt. Thomasena Edis, M.D.             DATE OF BIRTH:  [birth year]   DATE OF ADMISSION:  05/17/2002  DATE OF DISCHARGE:  05/24/2002                                 DISCHARGE SUMMARY   DISCHARGE DIAGNOSES:  1. Left total hip arthroplasty secondary to osteoarthritis.  2. History of rheumatoid arthritis.  3. History of hypertension.  4. History of cardiovascular disease.  5. History of elevated cholesterol.  6. Cellulitis.  7. Hypokalemia.   HISTORY OF PRESENT ILLNESS:  The patient is a 77 year old white female with  past medical history of left acetabular fracture secondary to fall, who  underwent transfer to [facility] in February 2003 with hip OA, admitted on May 12, 2002 for left total hip arthroplasty by Dr. Loraine Leriche C. Yates.  Patient on  Coumadin for DVT prophylaxis.  PT reports at this time indicates the patient  is ambulating minimal assist approximately 35 feet with rolling walker, is  touch-down weightbearing, can transfer sit to stand with minimal assistance,  needing mobilizer at all times.  Hospital course significant for  constipation, anemia, transfusion and urinary retention.   PAST MEDICAL HISTORY:  Past medical history significant for cardiovascular  disease, MI, CHF, GERD, diabetes, type 2, RA and elevated cholesterol.   PAST SURGICAL HISTORY:  Past surgical history is significant for PTCA, ORIF  of left elbow, ovary removed.   PRIMARY CARE [place]:  Dr. Christiana Fuchs.   ALLERGIES:  CODEINE, CELEBREX, SULFA and DEMEROL.   SOCIAL HISTORY:  The patient lives alone [place]-level home [place] step to entry, independent prior to admission.  Tobacco:  Quit smoking four years ago.  Occasional alcohol.  Three children.   Has  friends and family to assist.   REVIEW OF SYSTEMS:  Review of systems significant for joint pain and  swelling.   FAMILY HISTORY:  Noncontributory.   HOSPITAL COURSE:  The patient was admitted to [facility] Department on  May 17, 2002 for comprehensive inpatient rehabilitation and received more  than three hours of PT and OT therapy daily.  Her seven-day stay while in  rehab was significant for constipation and hypokalemia and muscle spasms.  Overall, the patient made fairly good progress while in rehab.  The patient  was on Coumadin for DVT prophylaxis during the entire stay.  She had venous  Dopplers performed on May 18, 2002 which demonstrated no evidence of  Baker's cyst or DVT.  Labs performed on May 21, 2002 demonstrated she had  potassium of 3.4; she was started on K-Dur 20 mEq p.o. b.i.d. for two days  on 05/18/02.  After the addition of potassium, potassium level did improve.  The patient received sorbitol as needed for constipation.  She complained of  muscle spasm on May 20, 2002; she received Skelaxin 40 mg p.o. p.r.n. for  muscle spasm.  Pain was then controlled with the medicine she had.  Blood  pressure was under great control on hydrochlorothiazide and Altace.  She had  no significant RA problems while she was in rehab.   Latest labs indicated that her latest hemoglobin was 10.3, 31.0, white blood  count was 5.4, platelet count of 200,000.  Latest INR was 2.8.  Sodium was  138, potassium 3.9, chloride 103, CO2 27, glucose 117, BUN 9, creatinine  0.7, AST 28, ALT 31.  The patient was on ferrous sulfate 325 mg p.o. b.i.d.  for anemia; this was discontinued on May 18, 2002 per patient request.  Prior to admission on May 23, 2002, the patient was started on Keflex 500  mg p.o. t.i.d. for seven days per orthopedics due to redness around surgery  incision.  PT report indicated that the patient was ambulating approximately  modified independent with a goal of  150 feet, could transfer sit to stand,  modified independently, gait mobility, modified independent.  She could  perform most ADLs, modified independently.  At time of discharge, all vitals  were stable, blood pressure 118/66, respiratory rate was 20, temperature  97.2, pulse of 87.  Staples were removed prior to discharge and Steri-Strips  were applied.   DISCHARGE MEDICATIONS:  1. K-Dur 20 mEq p.o. once twice daily.  2. Hydrochlorothiazide 25 mg daily.  3. Toprol 25 mg daily.  4. Altace 2.5 mg daily.  5. Zocor 10 mg daily.  6. Zantac 75 mg two tablets daily.  7. Elavil 25 mg daily.  8. Coumadin 2.5 mg one tablet daily until June 12, 2002, then stop, hold     aspirin while on Coumadin.  9. Vicodin 5/500 mg one to two tabs every four to six hours as needed for     pain.   ACTIVITY:  Use walker, toe touchdown weightbearing.   DIET:  Low-salt diet.   SPECIAL DISCHARGE INSTRUCTIONS:  Resume aspirin on June 13, 2002.  [facility] for PT, OT, home health aide and nurse.  Nurse to  monitor INR/PT on Wednesday, May 26, 2002.    FOLLOWUP:  Follow up with Dr. Annell Greening within two weeks.  Follow up  primary care [facility] Chui within one to two weeks.  Follow up with Dr. Rande Brunt.  Collins as needed.      Junie Bame, P.A.                       Daniel L. Thomasena Edis, M.D.    LH/MEDQ  D:  07/15/2002  T:  07/19/2002  Job:  [id number]   cc:   Loraine Leriche C. Ophelia Charter, M.D.  [address]  [place], [place] [address]  Fax: [phone]   Christiana Fuchs, M.D.  [address]  [place], [place] [address]  Fax: [phone]

## 2011-03-15 NOTE — Discharge Summary (Signed)
NAMEJERLINE, [name]                          ACCOUNT NO.:  [id number]   MEDICAL RECORD NO.:  [id number]                   PATIENT TYPE:  INP   LOCATION:                                       FACILITY:  [facility]   PHYSICIAN:  Ellwood Dense, M.D.                DATE OF BIRTH:  [birth year]   DATE OF ADMISSION:  05/10/2003  DATE OF DISCHARGE:  05/18/2003                                 DISCHARGE SUMMARY   DISCHARGE DIAGNOSES:  1. Right distal humerus fracture, status post open reduction and internal     fixation, May 06, 2003.  2. Pain management.  3. Non-insulin-dependent diabetes mellitus.  4. Congestive heart failure.  5. Coronary artery disease.  6. Rheumatoid arthritis.   HISTORY OF PRESENT ILLNESS:  An 77 year old female admitted on May 06, 2003,  after a fall without loss of consciousness, sustaining a right distal  humerus fracture, underwent open reduction and internal fixation on May 06, 2003, per Dr. August Saucer.  Placed in a shoulder sling, advised nonweightbearing,  minimum to moderate assist for ambulation.  Latest chemistries are  unremarkable.  Admitted for a comprehensive rehab program.   PAST MEDICAL HISTORY:  See discharge diagnoses.   PAST SURGICAL HISTORY:  1. A left total hip arthroplasty, March 2003, of which she received     inpatient rehab services for.  2. Also, a right shoulder arthroplasty, November 2003.   ALLERGIES:  1. PENICILLIN.  2. CODEINE.  3. SULFA.  4. DEMEROL.  5. TYLOX.   ALCOHOL AND TOBACCO:  No alcohol.  She does smoke cigarettes.   MEDICATIONS PRIOR TO ADMISSION:  1. Hydrochlorothiazide.  2. Zantac.  3. Plaquenil.  4. Ativan.  5. Elavil.  6. Toprol.  7. Altace.  8. Actos.  9. Aspirin.  10.      Salsalate.   SOCIAL HISTORY:  Lives alone in [place].  Independent prior to admission;  used a cane at times.  A one-level home.  Local family works.   HOSPITAL COURSE:  Patient did well while on rehabilitation services with  therapies initiated daily.  The following issues were followed during the  patient's rehab course:  Pertaining to Ms. Dickison's right distal humerus  fracture, she had undergone open reduction and internal fixation on May 06, 2003, per Dr. August Saucer, advised nonweightbearing with a shoulder sling.  She was  independent, ambulating on the floor.  Vicodin as needed for pain control  with good results.  She has a history of non-insulin-dependent diabetes  mellitus with the latest blood sugars 102, 79, 118, 109.  It was noted that  she was on Actos 15 mg prior to admission; this had never been resumed  during her hospital stay, since May 10, 2003.  She will follow up with her  primary M.D.  She exhibited no signs of fluid overload, no chest pain, no  shortness of breath,  noted history of coronary artery disease, on aspirin  therapy.  She continued on Plaquenil and Salsalate for history of rheumatoid  arthritis.  There were no bowel or bladder disturbances.  She will be  discharged to home with home health therapies.   LABORATORY DATA:  Latest labs showed a sodium of 140, potassium 3.5, BUN 13,  creatinine 1.2, hemoglobin 10.3, hematocrit 29.4.   DISCHARGE MEDICATIONS:  1. Elavil 50 mg at bedtime.  2. Hydrochlorothiazide 25 mg daily.  3. Plaquenil 200 mg twice daily.  4. Toprol-XL 25 mg daily.  5. Protonix 40 mg daily.  6. Altace 2.5 mg daily.  7. Salsalate 750 mg four times daily.  8. Zocor 10 mg daily.  9. Multivitamin daily.  10.      Aspirin 81 mg daily.  11.      Ativan 1 mg at bedtime.  12.      Vicodin as needed for pain.   ACTIVITY:  Nonweightbearing to right arm.   DIET:  Regular.   SPECIAL INSTRUCTIONS:  Home health physical and occupational therapy.   FOLLOW UP:  1. Patient should follow up with Dr. August Saucer in one week for followup x-rays.  2. Dr. ___________, medical management.  It was noted the patient remained     off Actos with blood sugars within normal limits and  advised to follow up     with the primary M.D.     Mariam Dollar, P.A.                     Ellwood Dense, M.D.    DA/MEDQ  D:  05/18/2003  T:  05/18/2003  Job:  [id number]   cc:   Ellwood Dense, M.D.  [address]  [place] [address]  Fax: [phone]   G. Dorene Grebe, M.D.  [address]  [place]  [place] [address]  Fax: [phone]   Aram Candela. Aleen Campi, M.D.  [address]  [place]  [place] [address]  Fax: [phone]   Lemmie Evens, M.D.  [address]  [place]  [place] [address]  Fax: [phone]    cc:   Ellwood Dense, M.D.  [address]  [place] [address]  Fax: [phone]   G. Dorene Grebe, M.D.  [address]  [place]  [place] [address]  Fax: [phone]   Aram Candela. Aleen Campi, M.D.  [address]  [place]  [place] [address]  Fax: [phone]   Lemmie Evens, M.D.  [address]  [place]  [place] [address]  Fax: [phone]

## 2011-03-15 NOTE — Discharge Summary (Signed)
NAME:  [name], [name]                          ACCOUNT NO.:  [id number]   MEDICAL RECORD NO.:  [id number]                   PATIENT TYPE:  INP   LOCATION:  5009                                 FACILITY:  [facility]   PHYSICIAN:  Genene Churn. Barry Dienes, P.A.                DATE OF BIRTH:  01/14/1920   DATE OF ADMISSION:  09/10/2002  DATE OF DISCHARGE:  09/14/2002                                 DISCHARGE SUMMARY   DISCHARGE DIAGNOSES:  1. Status post right shoulder hemiarthroplasty.  2. History of rheumatoid arthritis.   HISTORY OF PRESENT ILLNESS:  This 77 year old white female with a history of  right shoulder dislocation and persistent pain, presented to our office for  a preoperative evaluation for a right shoulder hemiarthroplasty.  She had  progressively worsening pain after having her dislocation.  X-rays confirmed  right shoulder dislocation.   PRE-ADMISSION LABORATORY DATA:  WBC 7.1, RBC 3.98, hemoglobin 13.2,  hematocrit 39.5, platelets 256.  PT 13.9, INR 1.0, PTT 32.  Sodium 135, K  4.2, Cl 97, CO2 of 27, glucose 126, BUN 12, creatinine 0.8.  Calcium 8.8, TP  6.5, albumin 3.2, AST 27, ALT 24, ALP 155, T-bilirubin 0.4.  Urinalysis  showed moderate leukocyte esterase.   HOSPITAL COURSE:  On September 10, 2002, the patient was taken to the [facility] Operating Room and a right shoulder  hemiarthroplasty procedure was performed.  The surgeon was [employer]. Ophelia Charter,  M.D. and assistant Genene Churn. Barry Dienes, P.A.-C.  The anesthesia was general.  The  estimated blood loss was less than 300 cc.  There were no surgical or  anesthesia complications.  The patient was transferred to the recovery room  and then to the orthopedic floor in stable condition.  On September 11, 2002, the patient had good pain control, a temperature of  100.1 degrees, hemoglobin 10.9.  The Hemovac drain was pulled.  Encouraged  in spirometry q.2h. while awake.  OT and PT evaluated the patient.  On  September 12, 2002, the patient continued to complain of pain.  Discharge was  held.  On September 13, 2002, the patient had better pain control.  Hemoglobin 10.5.  Vital signs were stable, afebrile.  OxyContin was  discontinued.  On September 14, 2002, the patient was doing extremely well.  She was pleased with the outcome of her surgery thus far.  She was found  ready to be discharged home.   DISCHARGE MEDICATIONS:  1. Tylox one to two tab p.o. q.4-6h. p.r.n. pain.  2. Colace 100 mg p.o. b.i.d.  3. Resume previous home medications.   CONDITION ON DISCHARGE:  Good and stable.   INSTRUCTIONS:  The patient will work with home health physical therapy and  occupational therapy daily.  She will have a home health aide.  She may ice  the shoulder off and on.    FOLLOW UP:  She will follow up in the office in one week for a recheck with  x-rays.  If there are any problems or complications before that time, she  will call and schedule an earlier appointment.                                                Genene Churn. Denton Meek.    JMO/MEDQ  D:  10/04/2002  T:  10/04/2002  Job:  [id number]

## 2011-03-15 NOTE — Op Note (Signed)
NAMEKAREY, SUTHERS                          ACCOUNT NO.:  [id number]   MEDICAL RECORD NO.:  [id number]                   PATIENT TYPE:  INP   LOCATION:  5023                                 FACILITY:  [facility]   PHYSICIAN:  Burnard Bunting, M.D.                 DATE OF BIRTH:  13-Jul-1920   DATE OF PROCEDURE:  06/19/2004  DATE OF DISCHARGE:  06/22/2004                                 OPERATIVE REPORT   PREOPERATIVE DIAGNOSIS:  Right distal humerus nonunion.   POSTOPERATIVE DIAGNOSES:  Right distal humerus nonunion.   PROCEDURE:  Right distal humerus removal of hardware, ulnar nerve  transposition and repeat internal fixation with Symphony bone grafting.   SURGEON:  Cammy Copa, M.D.   ASSISTANT:  Vear Clock, M.D.   ANESTHESIA:  General endotracheal.   ESTIMATED BLOOD LOSS:  650.   TOURNIQUET:  Two hours at 250 mmHg.   PROCEDURE IN DETAIL:  The patient was brought to the operating room where a  general endotracheal anesthesia was induced, preoperative antibiotics were  administered.  The patient was placed prone on the operating room table with  all body parts well padded.  Right arm was then prepped with DuraPrep  solution and draped in a sterile manner up to the shoulder.  The operative  field was covered with I-band.  The arm was elevated, exsanguinated with the  Esmarch, wrapped and tourniquet was inflated.  Posterior pressure to the  elbow was utilized.  A prior incision was utilized.  The skin and  subcutaneous tissue were sharply __________ closed with electrocautery.  The  triceps muscle was split bluntly down to bone distally and the dissection  was carried proximally in a blunt fashion up to the superior aspect of the  plate.  At this time the ulnar nerve was identified within the cubital  tunnel.  It was dissected free using meticulous technique.  The  intermuscular septum was excised.  The ulnar nerve was freed up 10 cm  proximally and distally into the SCU  muscle belly.  The two vessel loops  were placed around the nerve which was protected at all times throughout the  remaining portion of the case.  At this time, blunt dissection was performed  on both sides of the distal humerus.  Obvious nonunion was present.  Significant bone cavities were noted from the nonunion site which were not  apparent on plain radiographs.  On hardware removal, the bone ends were  exposed.  Fibrous tissue was removed from the nonunion site.  The  intramedullary canal was reestablished.  An osteotome was used to feather  the ends of both nonunion fragments in order to facilitate blood flow.  At  this time with the ulnar nerve protected, a medial __________ plate was then  placed on the medial aspect of the fracture.  Four screws were then placed.  Good alignment and location was  confirmed in the AP and lateral planes under  fluoroscopy.  At this time a lateral plate was placed and secured with 2  screws but adequate fixation above the nonunion site was not present and,  thus, a longer plate was chosen.  The longer lateral plate was then  fashioned to fit along the lateral aspect of the distal humerus with good  fixation achieved in the distal fragment and the proximal cortex.  No soft  tissue was present underneath either plate.  Blunt dissection was required  approximately 15 cm above the olecranon fossa for exposure which are  approximately 12-15 cm above the articular surface for exposure.  The  dissection did not proceed approximately to the spiral groove but blunt  dissection was performed laterally where the radial nerve crossed the  intermuscular septum and this required retraction in order to facilitate  placement of the screws.  Following placement of screws into the plate, the  elbow was taken through a range of motion, was found to have excellent  stability.  The incision was thoroughly irrigated.  Symphony bone graft was  then placed into the nonunion  site.  The ulnar nerve was then mobilized and  transposed anteriorly over a fascial sling which was sutured to the  overlying subcutaneous tissue.  Tourniquet was released and bleeding points  encountered were controlled with electrocautery.  Hemovac drain was placed.  The fascial sling to the ulnar nerve was sutured to the skin using a 2-0  Ethibond suture.  The triceps was then closed using interrupted inverted #1  Vicryl suture followed by interrupted inverted 2-0 Vicryl suture and the  skin staples.  Patient was placed in a posterior splint.  She tolerated the  procedure well without immediate complications.                                               Burnard Bunting, M.D.    GSD/MEDQ  D:  06/26/2004  T:  06/26/2004  Job:  [id number]

## 2011-03-15 NOTE — Procedures (Signed)
[facility]  Patient:    [name], [name]                       MRN: [id number] Proc. Date: 10/17/00 Adm. Date:  [id number] Disc. Date: [id number] Attending:  Sabino Gasser                           Procedure Report  PROCEDURE:  Upper endoscopy.  SURGEON:  Sabino Gasser, M.D.  INDICATIONS:  Dysphagia.  ANESTHESIA:  Fentanyl 75 mcg and Versed 7 mg.  DESCRIPTION OF PROCEDURE:  With the patient mildly sedated in the left lateral decubitus position, the Olympus videoscopic endoscope was inserted in the mouth and passed under direct vision through the esophagus.  The distal esophagus was approached and appeared relatively normal, and possibly narrowed.  It was photographed.  We entered into the stomach.  The fundus, body, and antrum all appeared diffusely erythematous.  We took a photo of the antrum and subsequently biopsied these erythematous areas.  We exited the stomach and went into the second portion of the duodenum through the duodenal bulb, both of which appeared normal and a photograph taken.  From this point, the endoscope was slowly withdrawn taking circumferential views of the entire duodenal mucosa until the endoscope had been pulled back and the stomach placed on retroflexion to view the stomach from below.  The endoscope was then straightened.  Biopsies were taken as noted.  Subsequently, a guidewire was passed and then the endoscope removed.  Dilators 14, 16, and 17 were passed easily.  With the last in, the guidewire was removed and the endoscope reinserted.  No significant bleeding or trauma noted.  The endoscope was withdrawn taking circumferential views of the remaining esophageal mucosa which otherwise appeared normal.  The patients vital signs and pulse oximeter remained stable.  The patient tolerated the procedure well without apparent complications.  FINDINGS:  Diffuse erythema of the stomach, await biopsy report.  The patient will call  me for results.  Esophagus dilated to 14, 15, and 17 Savary dilator, await clinical response.  Have patient on liquid diet today, and resume regular diet tomorrow, and have the patient follow up with me as an outpatient. DD:  10/17/00 TD:  10/19/00 Job: [id number]

## 2011-03-15 NOTE — Discharge Summary (Signed)
NAMEAERIE, DONICA                          ACCOUNT NO.:  [id number]   MEDICAL RECORD NO.:  [id number]                   PATIENT TYPE:  ORB   LOCATION:  4501                                 FACILITY:  [facility]   PHYSICIAN:  Erick Colace, M.D.           DATE OF BIRTH:  15-May-1920   DATE OF ADMISSION:  06/22/2004  DATE OF DISCHARGE:  06/29/2004                                 DISCHARGE SUMMARY   DISCHARGE DIAGNOSES:  1.  Nonunion right distal humerus fracture status post ICBG with fixation      August 23.  2.  Pain management.  3.  Non-insulin-dependent diabetes mellitus.  4.  Anemia.  5.  Gastroesophageal reflux disease.  6.  Rheumatoid arthritis.  7.  Congestive heart failure.  8.  History of left total hip replacement in ___________.  9.  History of left acetabular pubic rami fracture.  10. Coronary artery disease with permanent pacemaker.  11. Hypertension.   HISTORY OF PRESENT ILLNESS:  An 77 year old white female.  History of left  acetabular fracture January of 2003.  Received inpatient rehabilitation  services as well as a left total hip replacement in July of 2003 with  inpatient rehabilitation.  Noted right distal humerus fracture with open  reduction internal fixation January of 2004 which she was admitted to  subacute care unit.  Now admitted June 19, 2004 with findings of a  nonunion distal humerus fracture.  Underwent redo ICBG with fixation August  23 per Dr. August Saucer.  Shoulder sling placed to the right upper extremity with  pain management ongoing.  Hospital course uneventful.  She was admitted for  an inpatient subacute care therapy program.   PAST MEDICAL HISTORY:  See discharge diagnoses.   ALLERGIES:  SULFA, CELEBREX, TYLOX, CODEINE, DEMEROL.   SOCIAL HISTORY:  Lives alone in [place].  Used a cane prior to admission.  One-level home.  One step to entry.  Local daughter works.  She was  independent with a cane prior to admission.   MEDICATIONS:  1.  Salsalate 1500 mg b.i.d.  2.  Altace 5 mg daily.  3.  Aspirin 81 mg daily.  4.  Actos 15 mg daily.  5.  Plaquenil 400 mg daily.  6.  Prilosec 20 mg daily.  7.  Elavil 50 mg at bedtime.  8.  Lorazepam 2 mg at bedtime.  9.  Zocor 10 mg daily.  10. Requip daily.  11. Atenolol 25 mg daily.   HOSPITAL COURSE:  Patient with progressive gains while on rehabilitation  services with therapies initiated daily.  The following issues were followed  during patient's rehabilitation course.  Pertaining to Ms. Biondolillo's nonunion  right distal humerus fracture, fixation August 23 per Dr. August Saucer.  Shoulder  sling remained in place.  Neurovascular sensation remained intact.  Pain  management ongoing with the use of Vicodin.  Plan was to follow up with Dr.  August Saucer for plan  of gentle range of motion in the near future.  She continued  on oral agents for her non-insulin-dependent diabetes mellitus with blood  sugars 107, 146, and 130.  She had a history of gastroesophageal reflux  disease maintained on Protonix.  Postoperative anemia 8.8, 9.8,  respectively.  She continued on iron supplement.  She exhibited no signs of  fluid overload with noted history of congestive heart failure.  Her lungs  remained clear to auscultation.  She had a history of rheumatoid arthritis  maintained on Plaquenil as well as salsalate with no flare-ups during her  rehabilitation stay.  She had a long history of a left total hip replacement  in July of ________ as well as left acetabular pubic rami fractures.  These  were without incident during her rehabilitation stay.  Blood pressures  controlled with Altace.  Also noted with history of permanent pacemaker.  She had no chest pain or shortness of breath throughout her rehabilitation  stay.  No bowel or bladder disturbances.   Overall, for her functional mobility she was supervision bed mobility and  transfers, ambulating 75 feet with a straight point cane, independent   wheelchair mobility, supervision for stairs as well as car transfers,  supervision to minimal assist for activities of daily living for lower  extremity bathing and dressing.   DISCHARGE MEDICATIONS:  1.  Elavil 50 mg p.o. q.h.s.  2.  Plaquenil 400 mg p.o. daily.  3.  Protonix 40 mg p.o. daily.  4.  Altace 5 mg p.o. daily.  5.  Salsalate 1500 mg b.i.d.  6.  Ativan 2 mg at bedtime.  7.  Zocor 10 mg daily.  8.  Actos 15 mg daily.  9.  Requip one tablet daily.  10. Vicodin as needed pain.   ACTIVITY:  Tolerated.  Shoulder sling to right upper extremity.   DIET:  Diabetic diet.   SPECIAL INSTRUCTIONS:  Home health physical and occupational therapy.  She  should follow up with Dr. Nicholos Johns concerning medical management, Dr.  Elsie Lincoln cardiology services, call for appointment.      Mariam Dollar, P.A.                     Erick Colace, M.D.    DA/MEDQ  D:  06/28/2004  T:  06/29/2004  Job:  [id number]   cc:   G. Dorene Grebe, M.D.  Fax: [phone]   Madaline Savage, M.D.  [id number]  [place]  [place] [address]  Fax: [phone]   Georgianne Fick, M.D.  [address]  [place]  [place] [address]  Fax: [phone]

## 2011-03-15 NOTE — Procedures (Signed)
[facility]  Patient:    [name], [name] Visit Number: [id number] MRN: [id number]          Service Type: END Location: ENDO Attending Physician:  Sabino Gasser Proc. Date: 08/28/01 Admit Date:  08/28/2001 Discharge Date: 08/28/2001                             Procedure Report  PROCEDURE:  Upper endoscopy with biopsy.  INDICATION:  Significant reflux despite Protonix in the morning. Specifically, she is having evening reflux symptomatology.  No dysphagia, and I suggested switching her Protonix to prior to dinner.  ANESTHESIA:  Demerol 50, Versed 8 mg.  DESCRIPTION OF PROCEDURE:  With patient mildly sedated in the left lateral decubitus position, the Olympus videoscopic endoscope was inserted into the mouth and passed under direct vision through the esophagus.  There was a questionable change of Barretts esophagus, photographed and biopsied.  We entered into the stomach; fundus, body, antrum, duodenal bulb, and second portion of duodenum were all visualized.  From this point, the endoscope was slowly withdrawn, taking circumferential views of the duodenal mucosa until the endoscope then pulled back into the stomach and placed in retroflexion to view the stomach from below.  The endoscope was then straightened and withdrawn, taking circumferential views of the entire gastric and esophageal mucosa.  There was probably some degree of gastroparesis in that there was prep material still in the stomach which precluded a thorough examination, but the antrum was quite erythematous, and it was biopsied.  The patients vital signs and pulse oximeter remained stable. The patient tolerated the procedure well without apparent complications.  FINDINGS: 1. Question of esophagitis and/or Barretts, biopsied. 2. Erythematous antrum, biopsied.  PLAN:  Await biopsy report.  The patient will call me for results and follow up with me as an outpatient.  Proceed to  colonoscopy as planned. Attending Physician:  Sabino Gasser DD:  08/28/01 TD:  08/31/01 Job: [id number]

## 2011-03-15 NOTE — Discharge Summary (Signed)
NAMECHELLSIE, GOMER NO.:  [id number]   MEDICAL RECORD NO.:  [id number]          PATIENT TYPE:  INP   LOCATION:  5023                         FACILITY:  [facility]   PHYSICIAN:  Burnard Bunting, M.D.    DATE OF BIRTH:  [birth year]   DATE OF ADMISSION:  06/19/2004  DATE OF DISCHARGE:  06/22/2004                                 DISCHARGE SUMMARY   DISCHARGE DIAGNOSIS:  Right distal humerus fracture, nonunion.   SECONDARY DIAGNOSES:  1.  History of right shoulder replacement.  2.  History of hip replacement.  3.  Pacemaker placement.   OPERATIONS AND PROCEDURES:  Open reduction internal fixation of right distal  humerus fracture, nonunion.   HOSPITAL COURSE:  The patient was admitted to the orthopedic service on  June 19, 2004. At that time, she underwent right humerus nonunion take  down with redo bone grafting and operative fixation. The patient was given 2  units of packed red blood cells postoperatively. She was noted following  surgery to have intact median nerve function but decreased ulnar function  and radial nerve function with paresthesias and decreased extension and  strength of the wrist. OT saw the patient and splinted her elbow. She was  maintained in a splint. Postoperative radiographs showed hardware to be in  good position. The patient's incision was intact on postoperative day #2.  She was transferred to the [facility] June 22, 2004 in good condition. She will  follow up with me in 7 days for suture removal. She will continue to work  with SACU and OT and PT for mobilization and activities of daily living.       GSD/MEDQ  D:  08/01/2004  T:  08/01/2004  Job:  [id number]

## 2011-03-15 NOTE — H&P (Signed)
NAME:  [name], [name]                          ACCOUNT NO.:  [id number]   MEDICAL RECORD NO.:  [id number]                   PATIENT TYPE:  INP   LOCATION:  NA                                   FACILITY:  [facility]   PHYSICIAN:  Genene Churn. Barry Dienes, P.A.                DATE OF BIRTH:  November 25, 1919   DATE OF ADMISSION:  DATE OF DISCHARGE:                                HISTORY & PHYSICAL   CHIEF COMPLAINT:  Right shoulder pain.   HISTORY OF PRESENT ILLNESS:  The patient is a 77 year old white female with  history of right shoulder dislocation.  The patient is here today (September 07, 2002) for a preoperative history and physical examination for a right  shoulder closed versus open reduction and possible hemiarthroplasty.  She  has recently had a fall within the past couple of weeks injuring her  shoulder.  She does complain of increased pain and decreased range of  motion.  She denies numbness, tingling in the right upper extremity.   CURRENT MEDICATIONS:  1. Hydrochlorothiazide 25 mg p.o. q.d.  2. Zantac 75 mg p.o. q.d.  3. Salsalate 750 mg two tablets p.o. b.i.d.  4. Plaquenil 200 mg p.o. b.i.d.  5. Ativan 2 mg p.o. q.d.  6. Elavil 25 mg p.o. q.d.  7. Toprol XL 25 mg p.o. q.d.  8. Altace 2.5 mg p.o. q.d.  9. Zocor 10 mg p.o. q.d.  10.      Nitroglycerin 0.4 mg sublingual PRN chest pain.   ALLERGIES:  CODEINE, DEMEROL, SULFA, PERCOCET.   PAST MEDICAL/SURGICAL HISTORY:  1. Ovary removal.  2. Status post open reduction, internal fixation, left olecranon fracture.  3. History of myocardial infarction.  4. History of congestive heart failure.  5. History of hypertension.  6. History of gastroesophageal reflux disease.  7. History of esophageal spasms.  8. Type 2 diabetes mellitus.   FAMILY HISTORY:  Positive for coronary artery disease, diabetes, stroke.   REVIEW OF SYMPTOMS:  Positive for history of arthritis, diabetes, rheumatoid  arthritis.  Positive for history of myocardial  infarction.  Currently the  patient denies chest pain, shortness of breath, abdominal pain, nausea and  vomiting, fever, chills, night sweats, seizures disorders.  All other  systems are noncontributory.   PHYSICAL EXAMINATION:  VITAL SIGNS: Temperature 97.7, pulse 84, respirations  12, blood pressure 140/76.  Height 5 feet 4 inches, weight 143 pounds.  GENERAL:  Pleasant elderly white female who is alert and oriented X3 and in  no acute distress.  HEENT:  Head is normocephalic, atraumatic.  Patient is wearing a patch over  the left eye.  She is status post cataract surgery.  Oral mucosa is pink and  moist.  No septal deviation.  NECK:  Full range of motion.  Supple, nontender.  Carotids are intact.  No  lymphadenopathy.  LUNGS:  Clear to auscultation bilaterally.  No wheezes,  rubs, rhonchi.  BREASTS:  Not examined.  HEART:  Regular rate and rhythm, S1, S2.  No murmurs, rubs or gallops noted.  ABDOMEN:  Round, nondistended.  Normal bowel sounds X4 quadrants.  Soft,  nontender.  No palpable masses or organomegaly.  GENITOURINARY:  Not examined.  EXTREMITIES:  On examination of the right shoulder the patient has severe  pain with attempted range of motion.  Sensory and motor function are intact.  She has a large area of ecchymoses of the upper arm.  Radial pulses are  intact.  There is a short arm cast on the right upper extremity secondary to  thumb fracture from her recent fall.  SKIN:  Warm and dry.   X-RAY DATA:  Right shoulder x-ray shows dislocation.   ASSESSMENT:  Right shoulder dislocation.   PLAN:  Right shoulder closed versus open reduction and possible  hemiarthroplasty.  The operative risks and nonoperative treatments have been  discussed with the patient.  Possible risks and complications such as  bleeding, infection, nerve damage, transfusion, fractures, dislocation, need  for re-operation, anesthesia complications and death also discussed.  All  questions are answered  and the patient wishes to proceed with the surgery as  scheduled.                                                Genene Churn. Denton Meek.    JMO/MEDQ  D:  09/10/2002  T:  09/10/2002  Job:  [id number]

## 2011-03-15 NOTE — Procedures (Signed)
[facility]  Patient:    [name], [name] Visit Number: [id number] MRN: [id number]          Service Type: END Location: ENDO Attending Physician:  Sabino Gasser Proc. Date: 08/28/01 Admit Date:  08/28/2001 Discharge Date: 08/28/2001                             Procedure Report  PROCEDURE:  Colonoscopy.  ANESTHESIA:  Demerol 10, Versed 0.5 mg.  DESCRIPTION OF PROCEDURE:  With the patient mildly sedated in the left lateral decubitus position, the Olympus videoscopic colonoscope was inserted into the rectum and passed under direct vision through a tortuous right colon to the cecum, identified by the ileocecal valve and appendiceal orifice, both of which were photographed.  From this point, the colonoscope was slowly withdrawn, taking circumferential views of the entire colonic mucosa, stopping first on the right colon where there were three polyps adjacent to each other that were all photographed and all removed using hot biopsy forceps technique setting of 3-3 blended current.  This was done until we reached the sigmoid where another polyp was seen and removed, and we pulled back to the rectum which appeared normal on direct view and showed polyps, again three together on retroflex view that were removed again using not biopsy forceps technique, again a setting of 3-3 blended current.  The endoscope was straightened and withdrawn.  The patients vital signs and pulse oximeter remained stable.  The patient tolerated the procedure well without apparent complications.  FINDINGS:  Multiple polyps throughout the colon, both right and left-sided. Await biopsy report.  The patient will call me for results and follow up with me as an outpatient.  Avoid nonsteroidal anti-inflammatory drugs for two weeks. Attending Physician:  Sabino Gasser DD:  08/28/01 TD:  08/31/01 Job: [id number]

## 2011-03-15 NOTE — Discharge Summary (Signed)
   [name], [name]                          ACCOUNT NO.:  [id number]   MEDICAL RECORD NO.:  [id number]                   PATIENT TYPE:  INP   LOCATION:  5731                                 FACILITY:  [facility]   PHYSICIAN:  Burnard Bunting, M.D.                 DATE OF BIRTH:  Nov 15, 1919   DATE OF ADMISSION:  05/06/2003  DATE OF DISCHARGE:  05/10/2003                                 DISCHARGE SUMMARY   DISCHARGE DIAGNOSIS:  Right elbow fracture.   SECONDARY DIAGNOSES:  1. Coronary artery disease.  2. Insulin-dependent diabetes.  3. Hypertension.  4. Rheumatoid arthritis.  5. Gastroesophageal reflux disease.  6. Esophageal spasm.  7. History of left olecranon fracture, left acetabular fracture, shoulder     replacement.   OPERATIONS AND PROCEDURES:  Right distal humerus fracture, open reduction  internal fixation, performed May 07, 2003.   HOSPITAL COURSE:  [name] is an 77 year old ambulatory female who fell  on the day of admission who reports right arm pain. Radiographs demonstrated  a distal humerus fracture. The patient was admitted to the orthopedic  service. At that time, cardiology consultation was obtained. Once the  patient's risk was assessed, she underwent open reduction internal fixation  of her right humerus fracture on May 07, 2003. She tolerated the procedure  well without any complications. She has had an otherwise uneventful  hospitalization. On postoperative day #1, right arm and hand was perfused,  __________  5/5, and no paresthesias in the medial or ulnar distribution.  The patient's dressing was changed prior to transfer. The incision was  intact. She was going to be maintained in a posterior splint for about five  days until range of motion could be started. The patient had no cardiac  events.   DISCHARGE MEDICATIONS:  Include previous medications plus Percocet one to  two p.o. q.4h. p.r.n. pain.   DISCHARGE INSTRUCTIONS:  She will follow up  with me in seven days for suture  removal. She will also need to be started on passive range of motion in the  SACU.                                                Burnard Bunting, M.D.    GSD/MEDQ  D:  06/07/2003  T:  06/08/2003  Job:  [id number]

## 2011-03-15 NOTE — Discharge Summary (Signed)
[place]. [facility]  Patient:    [name], [name] Visit Number: [id number] MRN: [id number]          Service Type: Attending:  Veverly Fells. Ophelia Charter, M.D. Dictated by:   Genene Churn. Earlene Plater, P.A.C. Adm. Date:  10/16/01 Disc. Date: 11/09/01                             Discharge Summary  FINAL DIAGNOSES:  1. Left acetabular fracture.  2. Closed fracture of pubis.  3. Open reduction and internal fixation left displaced olecranon fracture.  4. Respiratory failure.  5. Myocardial infarction, subendocardial initial episode.  6. Congestive heart failure.  7. History of rheumatoid arthritis.  8. Hypertension, not otherwise specified.  9. Diabetes mellitus type 2. 10. Asphyxia. 11. Anemia, not otherwise specified. 12. Coronary atherosclerosis of native coronary vessel.  HISTORY OF PRESENT ILLNESS:  A 77 year old white female who presented to the emergency room after falling on her left elbow and her left hip.  She had increased pain in both of these areas.  She was unable to ambulate after this incident.  She was taken to the emergency room and x-rays were taken which showed a left acetabular fracture, left displaced olecranon fracture, and left superior inferior pubic ramus fracture.  LABORATORY DATA:  Sodium 138, potassium 4.3, CL 103, CO2 28, glucose 145, BUN 15, creatinine 1.1.  PT 14.5, INR 1.2.  WBC 13.3, hemoglobin 13.3, hematocrit 38.1, platelets 184.  PTT 30.  HOSPITAL COURSE:  On December 20, the patient was transferred out to the floor and she was started on IV pain medicines and put in [facility] traction.  On October 17, 2001, hemoglobin 10.4, CBG 188.  On October 18, 2001, the patient was taken to the operating room and an ORIF of the left olecranon was performed and also a left distal femoral traction pin placement was done. Surgeon was [employer]. Ophelia Charter, M.D. and assistant Genene Churn. Earlene Plater, P.A.C. Anesthesia was general and EBL less than 100 cc.  There were  no surgical or anesthetic complications and the patient was transferred back to the floor in stable condition.  On October 19, 2001, the patient had good pain control. Vital signs were stable and afebrile.  Hemoglobin 8.0, but she was neurovascularly intact.  X-rays at that time showed no change in fracture and 10 pounds was added to the femoral traction pin.  She was given one unit of packed red blood cells.  On October 20, 2001, the patient was stable.  An additional 5 pounds was added to the traction.  At the end of that day, the patient complained of dyspnea at rest and she had wheezing in the upper lobes. O2 saturations 72% on room air.  EKG, chest x-ray, and ABGs were done. Pulmonary consult ordered and she was seen.  The patient was worked up.  The patient was seen by cardiology and workup showed elevated cardiac enzymes and showed that she had suffered an acute CMI.  On October 20, 2001, CPK 1059 and MB 46.2.  She was stable.  On October 21, 2001, x-ray showed improved position of the fracture.  The femoral head was still displaced but not as cephalad as before.  On October 22, 2001, the patient was stable.  Hematocrit 31.  On October 23, 2001, the patient was neurovascularly intact of the upper and lower extremity.  Hematocrit 33, WBC 9.2, platelets 150.  She was started on elbow  range of motion.  On October 23, 2001, the patient was seen by pulmonary and denied chest pain or any overt respiratory distress.  On December 28, the patient was stable.  On December 29, vital signs stable and afebrile.  Traction adjusted to remove pressure off heels.  She was ready to be transferred back to the orthopedic floor.  On October 28, 2001, the patient was stable.  No chest pain or shortness of breath.  On October 30, 2001, the patient had good pain control.  No chest pain or shortness of breath.  Vital signs stable and afebrile.  She was neurovascularly intact.  She had good left elbow  range of motion.  The patient was being prepared and transferred to [facility] and also being prepped for her heart catheterization.  On November 01, 2001, tpx was stable.  On January 6, she had a nutrition consult.  Potassium 3.2.  On November 05, 2001, traction was discontinued and pins capped.  On January 10, cardiac catheterization was performed.  On November 08, 2001, vital signs are stable and afebrile.  On January 13, Steinmann pin was pulled and she was ready for transfer to [facility].  DISPOSITION:  Discharged to [facility].  DISCHARGE MEDICATIONS:  The patient will remain on all previous medicines during her admission.  DISCHARGE INSTRUCTIONS:  She will work with rehabilitation to help increase her ambulation and work on her range of motion and strengthening.  She will have dressing changes p.r.n.  She will follow up in our office two or three weeks after her discharge from [facility]. Dictated by:   Genene Churn. Earlene Plater, P.A.C. Attending:  Veverly Fells. Ophelia Charter, M.D. DD:  01/30/02 TD:  02/01/02 Job: [id number]

## 2011-03-15 NOTE — Discharge Summary (Signed)
NAMETROY, HARTZOG                ACCOUNT NO.:  [id number]   MEDICAL RECORD NO.:  [id number]          PATIENT TYPE:  INP   LOCATION:  5029                         FACILITY:  [facility]   PHYSICIAN:  Mark C. Ophelia Charter, M.D.    DATE OF BIRTH:  [birth year]   DATE OF ADMISSION:  09/13/2005  DATE OF DISCHARGE:  09/15/2005                                 DISCHARGE SUMMARY   FINAL DIAGNOSIS:  1.  L4-L5 spinal stenosis with rheumatoid arthritis.  2.  Diabetes type 2.  3.  Coronary pacemaker.  4.  Hypertension.  5.  Status post hip replacement.   HISTORY:  This 77 year old female with rheumatoid arthritis developed  multifactorial spinal stenosis at L4-L5 with stenotic symptoms that failed  to respond to anti-inflammatories, pain medication, and epidural steroids.   ADMISSION LABORATORY DATA:  CBC with hemoglobin 14.1.  Normal protime and  PTT.  Entirely normal chemistry panel, CMP.  Urinalysis positive for  nitrite, moderate leukocyte esterase, and a few bacteria.   HOSPITAL COURSE:  The patient was admitted and received preoperative  antibiotics, underwent L4-L5 decompression, it was planned to use an  operating microscope with treatment of the multifactorial spinal stenosis.  Postoperatively, she was seen by physical therapy and occupational therapy.  She received IV Dilaudid and then progressed to p.o. Percocet initially and  then switched to Vicodin which she did better and was discharged home on  postop day two.  The incision was dry, she was ambulatory.  Good relief for  her preop pain.   FINAL DIAGNOSIS:  L4-L5 spinal stenosis.      Mark C. Ophelia Charter, M.D.  Electronically Signed     MCY/MEDQ  D:  10/09/2005  T:  10/10/2005  Job:  [id number]

## 2011-03-15 NOTE — Discharge Summary (Signed)
[name], [name]                ACCOUNT NO.:  [id number]   MEDICAL RECORD NO.:  [id number]          PATIENT TYPE:  INP   LOCATION:  5029                         FACILITY:  [facility]   PHYSICIAN:  Mark C. Ophelia Charter, M.D.    DATE OF BIRTH:  20-Dec-1919   DATE OF ADMISSION:  09/13/2005  DATE OF DISCHARGE:  09/15/2005                                 DISCHARGE SUMMARY   FINAL DIAGNOSIS:  1.  L4-L5 stenosis.  2.  Diabetes type 2.  3.  Rheumatoid arthritis.  4.  Status post hip replacement.  5.  Hypertension.  6.  Coronary artery disease.   PROCEDURE:  L4-L5 decompression.   HISTORY OF PRESENT ILLNESS:  77 year old female with multilevel degenerative  changes with severe 4-5 stenosis and neurogenic claudication progressive and  not responsive to conservative treatment and epidural steroids.  She is  admitted for decompression.   LABORATORY DATA:  Admission labs included normal CBC, normal chemistry  panel.  Urinalysis positive for nitrites, positive for moderate leukocyte  esterase, and a few bacteria.   HOSPITAL COURSE:  The patient was admitted and underwent the above  decompression procedure.  She had improvement.  She was a little bit slow to  ambulate and mobilize due to her rheumatoid arthritis in her shoulders,  elbows, knees, hips, and feet.  She was seen by physical therapy and once  she was ambulatory, she was discharged.   CONDITION ON DISCHARGE:  Improved.   DISCHARGE INSTRUCTIONS:  Office follow up in one week.  Vicodin for postop  pain.      Mark C. Ophelia Charter, M.D.  Electronically Signed     MCY/MEDQ  D:  10/24/2005  T:  10/24/2005  Job:  [id number]

## 2011-03-15 NOTE — Op Note (Signed)
[name], [name]                          ACCOUNT NO.:  [id number]   MEDICAL RECORD NO.:  [id number]                   PATIENT TYPE:  OIB   LOCATION:  3743                                 FACILITY:  [facility]   PHYSICIAN:  Richard A. Alanda Amass, M.D.          DATE OF BIRTH:  [birth year]   DATE OF PROCEDURE:  03/08/2004  DATE OF DISCHARGE:                                 OPERATIVE REPORT   PROCEDURE:  Implantation of permanent DDD Medtronic Enpulse model number  [id number], serial number [id number], pulse generator with passive-fixation  Medtronic steroid-eluting atrial and ventricular electrodes.   IMPLANTING PHYSICIANS:  Richard A. Alanda Amass, M.D., and assistant Darlin Priestly, M.D.   COMPLICATIONS:  None.   ESTIMATED BLOOD LOSS:  Approximately 25 mL.   ANESTHESIA:  5 mg Valium p.o. premedication, 1% local Xylocaine,  intermittent Versed 1 mg IV, fentanyl 100 mcg IV in divided doses for  sedation.   PREOPERATIVE DIAGNOSES:  1. Recurrent syncope with multiple falls and injuries, with documented sinus     pauses greater than 3.3 seconds and associated symptomatic bradycardia.  2. Coronary artery disease, status post circumflex stenting November 06, 2001.  3. Adult-onset diabetes mellitus.  4. Systemic hypertension.  5. Chronic cigarette use.  6. Hyperlipidemia on therapy.   POSTOPERATIVE DIAGNOSES:  1. Recurrent syncope with multiple falls and injuries, with documented sinus     pauses greater than 3.3 seconds and associated symptomatic bradycardia.  2. Coronary artery disease, status post circumflex stenting November 06, 2001.  3. Adult-onset diabetes mellitus.  4. Systemic hypertension.  5. Chronic cigarette use.  6. Hyperlipidemia on therapy.   Ventricular electrode:  Medtronic [id number] cm, serial number [id number].   Atrial electrode:  Medtronic #4592-45 cm, serial number [id number].   Bipolar thresholds:  Atrial threshold for capture 0.7 V at 0.5 msec;  impedance 460 Ohms, P-wave 3.8 mV.   Ventricular threshold for capture equals 0.4 V at 0.5 msec; impedance 713  Ohms, R-wave 11.0 mV with good slew rate.   Magnet rate at BOL equals 85, at [facility] magnet rate drops to 65 with reversion  to VVI mode.   PROCEDURE:  The patient was brought to the second floor CP lab in a  postabsorptive state after premedication with 5 mg Valium p.o. and given  vancomycin 1 g IV on call to the lab.  Aspirin was held prior to the  procedure.  The left anterior chest was prepped, draped in the usual manner,  1% Xylocaine was used for local anesthesia.  A left infraclavicular  curvilinear transverse incision was performed and brought down to the  prepectoral fascia using blunt dissection and electrocautery to control  hemostasis.  A pulse generator pocket was formed using blunt dissection.  The subclavian vein was entered with an 18 thin-walled needle and using  tandem #10 peel-away Cook introducers and a stainless __________ J-tip  guidewire, the electrodes were introduced with the sheaths peeled away and  the guidewire retained until positioning and testing of the electrodes was  completed.  The RV electrode was positioned in the RV apex and the RA  electrode was positioned in the right atrial appendage, both confirmed by  fluoroscopy and rotational maneuvers.  Threshold testing was performed.  The  guidewire was removed.  The electrodes were secured at the insertion site  with the previously-placed #1 figure-of-eight silk suture to prevent  migration and control hemostasis and further secured with two #1 silk  sutures around a silcone sewing collar for each electrode.  The pocket was  irrigated with 500 mg kanamycin solution, the sponge count was correct.  The  generator was hooked to the electrodes with a single hex nut tightened in  proper atrial-ventricular sequence.  The generator was delivered into the  pocket with the electrodes looped behind and loosely  secured to the  underlying muscle and fascia with a #1 silk suture to prevent migration.  The subcutaneous tissue was closed with two separate running layers of 2-0  Vicryl and the skin was closed with 5-0 subcuticular Vicryl suture and Steri-  Strips were applied.  Fluoroscopy revealed good position of the atrial and  ventricular electrodes, and Steri-Strips were applied.  Fluoroscopy revealed  good position of the atrial and ventricular electrodes.  The patient was  overriding on the atrial channel with atrial tracking and ventricular  packing at the end of the procedure.  [name] tolerated the procedure well, was  transferred to the holding area for postop care and programming in stable  condition.                                               Richard A. Alanda Amass, M.D.    RAW/MEDQ  D:  03/08/2004  T:  03/08/2004  Job:  [id number]   cc:   Madaline Savage, M.D.  [address]  [place]  [place] [address]  Fax: [phone]   Georgianne Fick, M.D.  [address]  [place]  [place] [address]  Fax: [phone]   Darlin Priestly, M.D.  [phone] [address], [address]  [place]  [place] [address]  Fax: [phone]

## 2011-03-15 NOTE — Cardiovascular Report (Signed)
[place]. [facility]  Patient:    [name], [name] Visit Number: [id number] MRN: [id number]          Service Type: MED Location: [id number] Attending Physician:  Silvestre Mesi Dictated by:   Aram Candela. Aleen Campi, M.D. Proc. Date: 11/06/01 Admit Date:  11/06/2001   CC:         Lilia Pro, M.D.  Veverly Fells. Ophelia Charter, M.D.  Cardiac Catheterization Lab   Cardiac Catheterization  PROCEDURE DONE BY:  Jonny Ruiz R. Aleen Campi, M.D.  PROCEDURES: 1. Left heart catheterization. 2. Coronary cineangiography. 3. Left internal mammary artery cineangiography. 4. Left ventricular cineangiography. 5. Angioplasty with percutaneous transluminal coronary angioplasty and stent    placement in the mid circumflex. 6. Perclose of the right femoral artery.  INDICATIONS FOR PROCEDURES:  This 77 year old female was admitted to the hospital on October 16, 2001, after a fall and fracture of her left acetabulum.  She underwent surgery and postoperatively, she had shortness of breath and a chest x-ray which showed mild pulmonary edema.  Serial enzymes were positive for myocardial ischemia versus a small subendocardial infarct. Evaluation at that time showed that her rhythm was stable and she had no further discomfort in her chest or epigastrium.  She was delayed for further cardiac workup until she could be moved from traction for a significant period of time.  Her traction was removed yesterday and she was transferred here for cardiac catheterization and possible angioplasty today.  DESCRIPTION OF PROCEDURE:  After signing an informed consent, the patient was premedicated with 50 mg of Benadryl intravenously and transported from her room at [facility] to the cardiac catheterization lab at [facility].  Her right groin was prepped and draped in a sterile fashion and anesthetized locally with 1% lidocaine.  A #6 French introducer sheath was inserted percutaneously  into the right femoral artery.  The #6 [place] #4 Judkins coronary catheters were used to make injections into the native coronary arteries.  The right coronary catheter was used to make a midstream injection into the left subclavian visualizing the left internal mammary artery.  A #6 French pigtail catheter was used to measure pressures in the left ventricle and aorta and to make a midstream injection into the left ventricle.  After noting a critical stenosis in her mid circumflex coronary artery and a moderate to severe lesion in her mid small, nondominant right coronary artery, we discussed our findings with the patient and discussed alternatives and elected to proceed with an angioplasty of the circumflex coronary artery.  We then selected a CLS3.5 left coronary guide catheter, #6 [place], and after proper preparation, it was inserted through the right femoral artery sheath and advanced to the root of the aorta.  After engaging the ostium of the left coronary artery, injections were made into the circumflex visualizing the lesion.  We then selected a short Hi-Torque Floppy guidewire which was advanced through the guide catheter and into the circumflex.  After moderate difficulty, it was advanced into the middle segment and the lesion was approached.  After being unable to cross the lesion with the guidewire alone, we then selected a 2.5- x 15-mm Monorail Maverick 2 balloon catheter which was advanced over the guidewire in position within the middle segment of the circumflex.  With the balloon catheter as further backup, we were then able to advance the guidewire through the lesion in the mid to distal segment of the circumflex and advance it into the distal  segment.  We then easily advanced the balloon catheter through the lesion and after placing it within the lesion, one inflation was made at eight atmospheres for 21 seconds.  The balloon catheter was then removed and further injections  into the circumflex showed moderate compromise of a side branch involved with the lesion and a second short Hi-Torque Floppy guidewire was advanced through the guide catheter and into the circumflex.  Multiple attempts at passing this new guidewire into the side branch for protection were unsuccessful.  We then selected a 2.75- x 20-mm Express Monorail stent deployment system and after proper preparation, it was inserted over the guidewire in position within the mid circumflex lesion.  The stent was then deployed with one inflation at 14 atmospheres for 38 seconds.  After this stent was deployed, the deployment balloon was removed and further injections in the left coronary artery showed an excellent angiographic result with 0% residual lesion and no evidence for dissection or clot.  The side branch in the AV groove remained opened and after intracoronary nitroglycerin, it had fairly good flow.  The patient tolerated the procedure well and no complications were noted.  At the end of the procedure, the catheter and sheath were removed from the right femoral artery and hemostasis was easily obtained with a Perclose closure system.  The patient experienced no chest pain, EKG change, or change in her blood pressure or heart rate.  MEDICATIONS GIVEN:  Nitroglycerin intracoronary 200 units x 2, Versed 1 mg IV x 3, morphine 1 mg IV once.  HEMODYNAMIC DATA:  Left ventricular pressure 120/4-17.  Aortic pressure 120/60 with a mean of 90.  Left ventricular ejection fraction was estimated at 60%.  CINE FINDINGS:  Coronary cineangiography: 1. Left coronary artery:  The ostium appears normal.  The left main has a mild    plaque which causes a 10-20% narrowing.  There is normal flow into the left    anterior descending and circumflex. 2. Left anterior descending artery:  There is mild plaque in the middle     segment causing a 10-20% narrowing in two focal areas.  The distal segment    appears  normal.  There is very normal antegrade flow throughout the LAD,    diagonal, and septal branches. 3. Circumflex coronary artery:  The proximal segment has mild irregularities    but with normal flow.  The middle segment has a segmental plaque    approximately 1.5 to 2 cm in length.  The proximal portion of this plaque    has a 50-60% stenosis and this is followed by a focal critical stenosis of    99% with slow TIMI-2 antegrade flow into the distal segment.  This distal    segment has a small AV branch and a larger distal obtuse marginal branch. 4. Right coronary artery:  The right coronary artery is a small vessel which    supplies a short segment of the posterior descending approximately    one-third of its distribution and a very short continuation in the AV    groove to the crux.  This is a nondominant right and has a focal 80%    stenosis in its middle segment.  The right ventricular branch appears    normal. 5. Left ventricular cineangiogram:  The left ventricular chamber size and    contractility appear normal. 6. The left internal mammary artery appears normal.  ANGIOPLASTY CINES:  Cines taken during the angioplasty procedure shows proper positioning of the guidewire  through the mid circumflex lesion in position in the large distal obtuse marginal branch and posterolateral branch.  Further cines show proper positioning of the balloon catheter with a good balloon form obtained.  Further cines showed proper positioning of the stent and final injections showed an excellent angiographic result post stent deployment with 0% residual lesion and normal TIMI-3 antegrade flow.  The small branch that was the continuation in the AV groove had an ostial stenosis which was partially relieved with the intracoronary nitroglycerin.  FINAL DIAGNOSES: 1. Two-vessel coronary artery disease with a critical 99% mid circumflex    lesion and an 80% lesion in a nondominant mid right coronary  artery. 2. Mild plaque with 20% stenosis or less in the left main and left anterior    descending artery. 3. Normal left ventricular function. 4. Successful percutaneous transluminal coronary angioplasty and stent    placement in the mid circumflex coronary artery. 5. Successful Perclose of the right femoral artery.  DISPOSITION:  Will monitor on the EAU for at least 12 hours and continue a heparin drip during this time.  Will not use a IIb/IIIa inhibitor because of her recent surgery.  She will be able to move her right leg and be fully active from her cardiac standpoint and right groin standpoint within two hours; however, we will continue the heparin drip for 12 hours prior to her being able to be transferred to the sub acute care unit for further care of her orthopedic problems. Dictated by:   Aram Candela. Aleen Campi, M.D. Attending Physician:  Silvestre Mesi DD:  11/06/01 TD:  11/06/01 Job: [id number]

## 2011-03-15 NOTE — Discharge Summary (Signed)
NAMEGAYATHRI, [name]                          ACCOUNT NO.:  [id number]   MEDICAL RECORD NO.:  [id number]                   PATIENT TYPE:  IPS   LOCATION:  4143                                 FACILITY:  [facility]   PHYSICIAN:  Mark C. Ophelia Charter, M.D.                 DATE OF BIRTH:  04-22-1920   DATE OF ADMISSION:  05/17/2002  DATE OF DISCHARGE:  05/24/2002                                 DISCHARGE SUMMARY   FINAL DIAGNOSES:  1. Status post left total hip arthroplasty with adductor release.  2. Hypertension.  3. Type 2 diabetes mellitus.  4. Long-term use of anticoagulants.   HISTORY OF PRESENT ILLNESS:  77 year old white female who was status post  left acetabular fracture with left hip osteoarthritis and chronic pain.  She  has had progressively worsening pain and has had left limb shortening.  No  response with conservative treatment.  Significant decrease in her daily  activity level due to the ongoing problems.  X-rays showed satisfactory  healing of the acetabulum on the left.  But there is limb length  discrepancy.   ADMISSION LABS:  EKG showed normal sinus rhythm with left axis deviation.  WBC 7.6, hemoglobin 15.3, hematocrit 46.5, platelets 210.  PT 13.5, INR 1.0,  PTT 30, NA 141, K 4.3, CL 102, CO2 29, glucose 94, BUN 14, creatinine 0.8,  calcium 9.3, TP 7.0, albumin 3.7, AST 33, ALT 21, ALP 111, t-bili 0.5,  urinalysis showed small leukocyte esterase and a few epithelials.  Few  bacteria.   HOSPITAL COURSE:  On May 12, 2002 the patient was taken to the operating  room at [facility] where a left total hip arthroplasty with adductor release  was performed.  Surgeon Loraine Leriche C. Ophelia Charter, M.D. and assistant Genene Churn. Denton Meek.  Anesthesia was general with local Marcaine for postop.  EBL 1400 cc.  There were no surgical complications and the patient was transferred to the  floor in stable condition.  On May 13, 2002 the patient had good pain  control.  Vital signs were stable and  afebrile.  The patient was given a  unit of packed red blood cells after her procedure on May 12, 2002.  PT  16.0, INR 1.3, hemoglobin 8.9, hematocrit 26.1.  She was neurovascularly  intact.  PT consult ordered with touchdown weight bearing on the left leg.  On May 14, 2002 hemoglobin 7.2, INR of 1.8.  She was given a unit of packed  red blood cells slowly.  She was scheduled to have another unit given on  May 15, 2002.  On May 15, 2002  bowel sounds stable and afebrile.  CBG  170.  On May 16, 2002 bowel sounds stable and afebrile.  Hemoglobin 8.5.  On May 17, 2002 she complained of some pain that was increased in her hip.  No bowel movement yet.  Bowel sounds stable and  afebrile.  PT 20.5, INR 2.0,  hemoglobin 9.8.  She was neurovascularly intact.  Fleet enema ordered.  Vicodin and Darvocet DC'ed.  She was given Tylox and Phenergan  suppositories.  The patient was doing much better and she is ready for  discharge and rehab.   DISCHARGE CONDITION:  Stable and good.   DISPOSITION:  Discharge to rehab.   MEDICATIONS:  1. Sliding scale insulin.  2. Colace 100 mg p.o. bid.  3. Coumadin pharmacy protocol.  4. Hydrochlorothiazide 25 mg p.o. q.d.  5. Valium 5 mg p.o. q.d.  6. Toprol-XL 25 mg p.o. q.d.  7. Altace 2.5 mg p.o. q.d.  8. Zocor 10 mg p.o. q.d.  9. Protonix 40 mg p.o. q.d.  10.      Elavil 25 mg p.o. q.h.s.  11.      FeSO4 325 mg p.o. bid.  12.      Milk of Magnesia 30 ml suspension.  13.      Dulcolax suppositories p.r.n. for constipation.  14.      ___________.  15.      Phenergan p.r.n. for nausea.  16.      Tylox 1-2 tabs p.o. q.4-6h. p.r.n. for pain.   INSTRUCTIONS:  The patient will continue with physical therapy to improve  her ambulation and strengthening.  The staples will be removed two weeks  postop.  She will remain on Coumadin three to four weeks for DVT  prophylaxis.  She will follow up in our office in one to two weeks after her  discharge from the  hospital.     Genene Churn. Denton Meek.                      Mark C. Ophelia Charter, M.D.    JMO/MEDQ  D:  06/26/2002  T:  06/28/2002  Job:  [id number]

## 2011-03-15 NOTE — Op Note (Signed)
[place]  Patient:    [name], [name] Visit Number: [id number] MRN: [id number]          Service Type: SUR Location: 5000 [id number] Attending Physician:  Jacki Cones Dictated by:   Veverly Fells Ophelia Charter, M.D. Proc. Date: 05/12/02 Admit Date:  05/12/2002                             Operative Report  PREOPERATIVE DIAGNOSIS:  Left acetabular fracture with post-traumatic osteoarthritis.  POSTOPERATIVE DIAGNOSIS:  Left acetabular fracture with post-traumatic osteoarthritis.  PROCEDURE:  Left cemented total hip arthroplasty.  Adduction release.  SURGEON:  Mark C. Ophelia Charter, M.D.  ASSISTANT:  Zonia Kief, P.A.-C.  ANESTHESIA:  GOT.  COMPONENTS USED:  All poly fixed 4 mm acetabular cup with autologous bone graft from the resected femoral head, wire mesh, acetabular screen.  A #7 cemented ODC stem, +10 neck.  DESCRIPTION OF PROCEDURE:  After induction of general anesthesia and orotracheal intubation, preoperative Ancef prophylaxis, the patients intubation was performed nasally rather than orally, and was an awake intubation due to the patients previous problems with the pinning of airway. The patient had fracture dislocation of her elbow, as well as a central acetabular fracture.  She was treated in traction for a period of time then went to rehab, and ultimately a ______ hyperfracture with the head displaced into the pelvis and she shortened almost 2 inches.  The patient was placed in the lateral position with axillary roll, standard prepping and draping was performed using the usual impervious stockinette and Coband.  Sterile skin marker was used prior to application of the Betadine biodrapes.  A posterior approach was made.  The gluteus maximus was split in line with its fibers.  A portion of gluteus maximus tendon was released partially up the femur due to the patients shortening and contracture.  The area was palpated.  Piriformis was tagged  for later repair.  Posterior capsule was completely resected, as well as the inferior and anterior capsule.  The patients neck was completely inside the old acetabulum with the head eroded into a new cavity inside the pelvis.  Neck was cut, and then the head removed with the corkscrew after resecting it into pieces.  Kerlix clean off the remaining head, and bone was ground up with the bone meal into grapenut size pieces.  Neck was further cut leaving it one fingerbreadth above the lesser trochanter. Homan retractor and sharp cut were placed to expose the acetabulum.  Marginal osteophyte was trimmed back, and splinter reaming was performed.  Soft tissue centrally was curetted off, and 90% of the socket had hard bone underneath the fibrous tissue.  Some areas of the bone centrally were thin, but they still had good coverage.  After the fibrous tissue was trimmed back, pulsatile irrigation was used to repair the bone.  A wire mesh screen was then cut, placed over the central area where the bone was rather thin.  Bone graft pieces were packed in the femur, was placed in ______ , and reaming was performed.  A 54 mm cup perched well on the rim.  Trial was inserted, position was checked.  The corner of the rim and old lateral wall was used with 45 degrees of abduction and 30 degrees of cut flexion.  After repeat irrigation and drying, cement was vacuum mixed and placed, and acetabular alignment guide was used to hold the acetabulum secure which  was an all poly cup 54 mm in size, until the cement was hardened. The wall was placed posterior superior.  A small sponge was placed over this.  All excessive cement had been removed. Trial broach was inserted with a +5 neck, and there was excellent stability with flexion to 90 degrees, full rotation at 90 degrees internally with no subluxation.  She would reach full extension, and she was tight as the knee would flex up to 90 degrees as expected.   There was minimal abduction, and a portion of the ileospoas was partially peeled off anteriorly, releasing about 70% of it.  Abductor was still tight.  Trial broach was removed.  Distal cement restrictor was placed.  Pulsatile vital pressure was used.  Cement was vacuum mixed, impacted on the prosthesis with the cement centralizer was inserted, which was a 14 mm tip.  Once the cement was hardened with appropriate 15 to 20 degrees of anteversion placed, trials were tried, and a +10 restored the patients significant leg length almost completely.  There was excellent stability.  A +10 ______ was popped on, impacted, hip reduced, piriformis repaired, gluteus maximus closed.  The area had been partially released.  Tensor fascia repaired with #1 Tycron, 0 Vicryl in the gluteus maximus fascia, 2-0 Vicryl in the subcutaneous tissue.  Skin staple closure, and Marcaine infiltration.  Xeroform and 4 x 4.  The adductor was then exposed and percutaneous adductor release was performed with a 15 blade which allowed 45 degrees of abduction rather than the 10 that was achieved prior to the release.  The patient was then closed with simple 4-0 nylon suture from the adductor release, Xeroform, 4 x 4 dressing was applied.  The patient was turned in the supine position.  Leg lengths were within 1/4 inch of correction.  Preoperatively, she was over 2 cm short at 2.2 cm in the supine position.  She appeared to be 1/4 inch short.  Pulses were 2+.  ______ function was intact, and the patient was transferred to the recovery room. Sponge, needle, and instrument counts were correct. Dictated by:   Veverly Fells Ophelia Charter, M.D. Attending Physician:  Jacki Cones DD:  05/12/02 TD:  05/17/02 Job: [id number]

## 2011-03-15 NOTE — Op Note (Signed)
[name], [name] NO.:  [id number]   MEDICAL RECORD NO.:  [id number]          PATIENT TYPE:  INP   LOCATION:  2899                         FACILITY:  [facility]   PHYSICIAN:  Mark C. Ophelia Charter, M.D.    DATE OF BIRTH:  02-28-1920   DATE OF PROCEDURE:  09/23/2005  DATE OF DISCHARGE:                                 OPERATIVE REPORT   PREOPERATIVE DIAGNOSIS:  L4-5 stenosis.   POSTOPERATIVE DIAGNOSIS:  L4-5 stenosis.   PROCEDURE:  L4-5 decompression.   SURGEON:  Mark C. Ophelia Charter, MD   ASSISTANT:  RNFA   ANESTHESIA:  GOT.   ESTIMATED BLOOD LOSS:  Minimal.   COMPLICATIONS:  None.   INDICATION:  [age]-year-old female with severe rheumatoid arthritis  with spinal stenosis, severe, with neurogenic claudication and inability to  ambulate or stand more than a few minutes secondary to her stenosis.  Preoperative workup documented the severe stenosis, multifactorial, at L4-5.   PROCEDURE:  After the induction of general anesthesia, orotracheal  intubation and placement on the Andrews frame, preoperative vancomycin,  standard prepping with DuraPrep in the usual manner, she was covered with  towels and Betadine and Vi-Drape applied, and laminectomy sheet.  Needle  localization with a needle placed at the expected 4-5 level confirmed that  the needle was slightly above the 4-5 level at the top of the foramina, or  inferior aspect of the pedicle.  An incision was made starting in the  midline and extended distally.  Self-retaining retractors were placed.  The  spinous process and lamina of L4 were removed.  The operative microscope was  draped and brought in.  Thick chunks of ligament were removed, dura was  visualized and then with patties used to protect the dura and using Kerrison  rongeurs, the ligament was removed from the lateral gutters, right and left.  Bone was removed all the way out to the level of the pedicle.  There were  large overhanging spurs from her  rheumatoid arthritis which were causing  severe stenosis of shoulder and the nerve root, which were removed back.  Once bone was removed out to the pedicle, the foramen was enlarged and nerve  root was followed out and axially it was checked with the operating  microscope draped, but inspection of the disk showed that there were no disk  fragments; she did have some mild bulging of the disk.  Disk was firm; no  microdiskectomy was necessary.  There was worse stenosis in the left than  right, but both sides were addressed with bone removed out to the pedicle.  The top portion of 5 was removed, taking the upper third.  After irrigation  with Betadine solution, dura was intact and fascia was  closed with 0 Vicryl and 2-0 Vicryl in the subcutaneous tissue.  Copious  irrigation was used intermittently throughout the case, 2-0 in the  subcutaneous tissue, skin staple closure, Marcaine infiltration of the skin  and postop dressing, and transferred to recovery room in stable condition.  Instrument count and needle count were correct.      Loraine Leriche  Becky Sax, M.D.  Electronically Signed     MCY/MEDQ  D:  09/13/2005  T:  09/14/2005  Job:  [id number]

## 2011-03-15 NOTE — H&P (Signed)
[place]  Patient:    [name], [name] Visit Number: [id number] MRN: [id number]          Service Type: SUR Location: 5000 [id number] [id number] Attending Physician:  Jacki Cones Dictated by:   Genene Churn. Barry Dienes, P.A.-C. Admit Date:  05/12/2002                           History and Physical  CHIEF COMPLAINT:  Left hip pain and decreased range of motion.  HISTORY OF PRESENT ILLNESS:  A 77 year old white female, who is status post left acetabular fracture and hip osteoarthritis in chronic pain, presents for a preop evaluation for a left total hip arthroplasty and adductor release. Since her acetabular fracture a few months ago, she has had decreased range of motion with ongoing pain, she has had limb shortening on the left, no response to conservative treatment.  Fracture on x-rays has shown satisfactory healing. She has had a significant decrease in her daily activity level due to the ongoing complaints.  CURRENT MEDICATIONS:  1. Hydrochlorothiazide 25 mg p.o. q.d.  2. Zantac.  3. Salsalate 750 mg p.o. q.i.d.  4. Plaquenil 200 mg p.o. b.i.d.  5. Ativan 2 mg p.o. q.d.  6. Elavil 25 mg p.o. q.d.  7. Toprol 25 mg p.o. q.d.  8. Altace 2.5 mg p.o. q.d.  9. Zocor 10 mg p.o. q.d. 10. Nitroglycerin tablets sublingual p.r.n. for chest pain.  ALLERGIES:  1. CODEINE -- nausea.  2. DEMEROL -- nausea.  3. PERCOCET -- nausea.  4. SULFA -- rash.  PREVIOUS SURGICAL HISTORY AND HOSPITALIZATIONS:  1. Ovary removal.  2. ORIF, left olecranon fracture.  3. History of myocardial infarction.  4. History of CHF.  5. Hypertension.  6. CAD.  7. GERD.  8. History of esophageal spasms.  9. History of type 2 diabetes. 10. Status post left acetabular fracture.  FAMILY HISTORY:  Positive for CAD, diabetes and stroke.  SOCIAL HISTORY:  The patient denies smoking or alcohol consumption.  REVIEW OF SYSTEMS:  The patient admits to having recent chest pain two  days ago for which she had to take one nitroglycerin tab; this did give relief to her pain.  Currently, denies any chest pain or shortness of breath at this interview.  Denies nausea, vomiting, abdominal pain.  Denies fevers, chills, night sweats.  Denies bleeding disorder, seizure disorders.  All other systems noncontributory.  PHYSICAL EXAMINATION:  VITAL SIGNS:  Temperature 97.1, pulse 66, respirations 16, blood pressure 110/60.  Height 5 feet 4 inches.  Weight 143 pounds.  GENERAL:  Pleasant, elderly white female who is alert and oriented x3 and in no acute distress.  HEENT:  Head is normocephalic, atraumatic.  PERRLA and EOMI.  Oral mucosa is pink and moist.  TMs are clear, pearly gray and intact.  No septal deviation.  NECK:  Full range of motion.  Supple and nontender.  Carotid pulses are palpable and intact.  No lymphadenopathy.  LUNGS:  CTA bilaterally.  No wheezes, rales or rhonchi noted.  BREASTS:  Not examined.  HEART:  RRR.  S1 and S2.  No murmurs, rubs, or gallops noted.  ABDOMEN:  Round, nondistended.  NABS x4.  Soft and nontender.  No palpable masses or organomegaly.  GENITOURINARY:  Not examined.  EXTREMITIES:  The patient walks with the assistance of a cane.  There is limb shortening on the left.  Left hip:  Internal rotation 5 to  10 degrees and external rotation of 20 to 30 degrees.  Right hip:  Internal rotation 45 degrees and external 45 degrees.  No hip trochanter tenderness.  Sensory and motor function are intact.  Pedal pulses are intact.  No isolated motor weakness.  She has good range of motion of the knees and ankles bilaterally.  SKIN:  Warm and dry.  LABORATORY AND ACCESSORY DATA:  X-rays showed a healed left acetabular fracture with 23 mm of shortening, status post severe protrusio.  ASSESSMENT:  Status post left acetabular fracture and traumatic osteoarthritis.  PLAN:  Left total hip arthroplasty with adductor release.  Operative risks  and nonoperative treatments discussed with the patient; possible risks and complications such as bleeding, transfusion, infection, nerve damage, fractures, anesthesia complications, DVT, pulmonary embolism and reoperation/revision also discussed, all questions answered and she wishes to proceed with the surgery at this time.  The possibility of bone graft absorption and cup migration were also discussed with the patient and her daughter who was present during the interview; both understand. Dictated by:   Genene Churn. Barry Dienes, P.A.-C. Attending Physician:  Jacki Cones DD:  05/12/02 TD:  05/15/02 Job: [id number]

## 2011-03-15 NOTE — Discharge Summary (Signed)
[name], [name]                          ACCOUNT NO.:  [id number]   MEDICAL RECORD NO.:  [id number]                   PATIENT TYPE:  OIB   LOCATION:  3743                                 FACILITY:  [facility]   PHYSICIAN:  Richard A. Alanda Amass, M.D.          DATE OF BIRTH:  [birth year]   DATE OF ADMISSION:  03/08/2004  DATE OF DISCHARGE:  03/09/2004                                 DISCHARGE SUMMARY   HISTORY OF PRESENT ILLNESS:  Ms. Guardiola is an 77 year old female patient who  came into the hospital for pacemaker placement.  She apparently has had  multiple episodes of syncope.  She has had a known 3.3 second pause.  She  had a DDD Medtronic impulse model [id number], serial [id number], pulse  generator with passive fixation of Medtronic steroid-eluding atrial and  ventricle electrodes.  On the morning of Mar 09, 2004, she was seen by Dr.  Nanetta Batty, and considered stable to be discharged home.  She had no  labs, except a cholesterol.  Her total cholesterol was 151, triglycerides  131, HDL 59, LDL 66.  Her blood pressure was 152/90, heart rate 78,  respirations 18.  Temperature was 98.2.  Room air saturations were 92%.  Her  chest x-ray post pacemaker placement showed leads in the correct position in  the pneumothorax; thus, she was discharged home.  She did have a pacer  interrogated prior to her discharge.   DISCHARGE DIAGNOSES:  1. Recurrent syncope with multiple falls and injuries with documented sinus     pauses greater than 3.3 seconds, and associated symptomatic bradycardia.  2. Status post implantation of a permanent DDD Medtronic impulse model     #[id number] by Dr. Susa Griffins on Mar 08, 2004.  3. Coronary artery disease status post circumflex __________ November 06, 2001.  4. Adult onset diabetes mellitus.  5. Systemic hypertension.  6. Chronic cigarette use.  7. Hyperlipidemia on therapy.  8. Rheumatoid arthritis.   DISCHARGE MEDICATIONS:  1. Aspirin 81 mg  once per day.  2. Multivitamins once per day.  3. Pepcid 20 mg twice per day.  4. Altace 5 mg once per day.  5. Plaquenil 200 mg once per day.  6. Ativan 2 mg at bedtime.  7. Elavil 50 mg once per day.  8. Zocor 10 mg once per day.  9. Atenolol 50 mg once per day.  10.      Tylenol for pacer site discomfort.   DISCHARGE INSTRUCTIONS:  She should do no stretching, reaching, lifting,  pushing, pulling x2 weeks.  May raise her arm on the pacer side to her  earlobe today and top of her head tomorrow.  If she has any problems with  swelling, oozing, or fever, she was to call our office.  She should keep her  pacemaker site clean and dry x4 days, and she can wash it with soap and  water  and pat it dry gently.  She is not to take the steri-strips office.   FOLLOW UP:  She has a follow up appointment with __________ to look at her  pacer site on Mar 19, 2004 at 11 a.m.      Lezlie Octave, N.P.                        Richard A. Alanda Amass, M.D.    BB/MEDQ  D:  03/09/2004  T:  03/10/2004  Job:  [id number]   cc:   Madaline Savage, M.D.  [address], [address]  [place]  [place] [address]  Fax: [phone]   Georgianne Fick, M.D.  [address]  [place]  [place] [address]  Fax: [phone]

## 2011-03-15 NOTE — Op Note (Signed)
NAME:  [name], [name]                          ACCOUNT NO.:  [id number]   MEDICAL RECORD NO.:  [id number]                   PATIENT TYPE:  INP   LOCATION:  NA                                   FACILITY:  [facility]   PHYSICIAN:  Mark C. Ophelia Charter, M.D.                 DATE OF BIRTH:  01/19/1920   DATE OF PROCEDURE:  09/10/2002  DATE OF DISCHARGE:                                 OPERATIVE REPORT   PREOPERATIVE DIAGNOSIS:  Right chronic shoulder dislocation.   POSTOPERATIVE DIAGNOSIS:  Right chronic shoulder dislocation.   PROCEDURE:  Right shoulder open reduction and humeral hemiarthroplasty.   SURGEON:  Mark C. Ophelia Charter, M.D.   ASSISTANT:  Genene Churn. Owens, P.A.-C.   ANESTHESIA:  General orotracheal.   ESTIMATED BLOOD LOSS:  300 cc.   DESCRIPTION OF PROCEDURE:  After the administration of general anesthesia  and orotracheal intubation, the patient had a history of difficult  intubation.  See Dr. Beverly Gust. Massagee's notes for the specific technique  used for the difficult intubation.  The patient was then transferred from  her patient bed to the shoulder holder frame.  The patient was placed in a  beach chair position with careful padding and positioning and her shoulder  was prepped with the arm with DuraPrep down to the short-arm cast.  The  usual impervious stockinette, Coban and split U-sheets, drapes.  A skin  marker, Betadine and Vi-Drape were used to seal the skin.  A deltopectoral  incision was made.  The cephalic vein was taken laterally with the deltoid.  The conjoined tendon was identified and was stuck in some scar tissue which  had to be released.  A self retaining retractor was placed underneath the  conjoined tendon.  The deltoid was elevated with a narrow Bennett.  The  capsule was descended and small.  A cut was made in the capsule and  immediate large amount of blood, old hematoma was evacuated.   Prior to the patient being prepped and draped, an attempt was made to  reduce  the arm with longitudinal traction with the patient completely paralyzed.  This was unsuccessful as expected.   The subscapularis tendon was tagged with #2 Ticron horizontal mattress  sutures x3.  A tuft of tissue was left for later repair, and continued  capsule release was performed until finally a bone hook could be stuck into  the bone and the shoulder was finally able to be reduced with some  difficulty.  Care was taken to make sure that the self retaining retractor  stayed underneath the conjoined tendon to protect the neurovascular bundle.  A small hole was drilled in the articular cartilage.  There was no egress of  blood, even with the shoulder in a relaxed position.  With the patient's  rheumatoid arthritis and articular damage that was present, it was elected  to proceed with a hemiarthroplasty.  Her shoulder had been dislocated for at  least three weeks, with her history of two significant falls in the last  three weeks.  She had noted some mild discomfort, but did not actually  complain of shoulder pain, and was just complaining of the short-arm bone  Spica cast feeling heavy.  The articular  cartilage was cut in appropriate 30-40 degrees of  retroversion.  Sequential hand reaming, followed by broaching was performed.  Trial was inserted.  There appeared to need to be a little bit more  retroversion.  This was rebroached and then once there was an excellent  position, keel cuts were made.  A #11 distal stem and a #11 proximal were  used, with a tight fit.  The proximal portion of the humerus was noted to be  rather fragile, as expected with her rheumatoid arthritis.  The calcar was  stable.  The long head of the biceps tendon was present.  The greater  tuberosity still had the supraspinatus attached, and then by palpation the  posterior rotator cuff muscles appeared attached.  The shoulder was reduced.  The ball that was selected was a #[id number] and was identical to the  size of the  fragment that was removed with the oscillating saw.  There was good range of  motion.  The subscapularis was repaired back through drill holes into the  bone and a single fixed anchor was placed for additional suturing in place  of the subscapularis tendon, and tied down.  A Hemovac drain was placed  through a separate stab incision, irrigation, and #2-0 Vicryl in the  subcutaneous tissue.  Skin staple closure with Marcaine infiltration of the  skin was then performed.   The patient tolerated the procedure well.  A postoperative dressing and  sling were applied.  She was transferred to the recovery room where a  shoulder immobilizer was attached.                                                Mark C. Ophelia Charter, M.D.    MCY/MEDQ  D:  09/10/2002  T:  09/10/2002  Job:  [id number]

## 2011-03-15 NOTE — Op Note (Signed)
NAME:  [name], [name]                          ACCOUNT NO.:  [id number]   MEDICAL RECORD NO.:  [id number]                   PATIENT TYPE:  OIB   LOCATION:  5731                                 FACILITY:  [facility]   PHYSICIAN:  Burnard Bunting, M.D.                 DATE OF BIRTH:  05-05-1920   DATE OF PROCEDURE:  05/07/2003  DATE OF DISCHARGE:                                 OPERATIVE REPORT   PREOPERATIVE DIAGNOSIS:  Right distal humerus fracture.   POSTOPERATIVE DIAGNOSIS:  Right distal humerus fracture.   PROCEDURE:  Right distal humerus fracture open reduction and internal  fixation.   SURGEON:  Burnard Bunting, M.D.   ANESTHESIA:  General endotracheal anesthesia.   ESTIMATED BLOOD LOSS:  75 mL.   DRAINS:  Hemovac x1.   TOURNIQUET TIME:  129 minutes at 350 mmHg.   DESCRIPTION OF PROCEDURE:  The patient was brought to the operating room  where after awake intubation had been performed preoperative IV antibiotics  were administered. The patient was placed in the lateral decubitus position  with  an axillary roll with the left axilla and left peroneal nerve well  padded. The patient's right arm was prepped with Duraprep solution and  draped in a sterile manner. Collier Flowers was used to cover the operative field.   Topographic anatomy of the arm was identified including the olecranon fossa  as well as the anticipated location of  the radial nerve approximately  10  cm proximal to the lateral epicondyle in the midportion of  the humerus. The  forearm was elevated and exsanguinated with the Esmarch wrap and the  tourniquet was inflated.   A 15-cm incision was made and the skin and subcutaneous tissue were sharply  divided. The interval between the lateral  and long end of the triceps was  developed bluntly, and then the tendinous portion was incised sharply.  Proximally the location of the radial nerve was identified. It was noted to  pierce the intramuscular septum about 9 cm proximal   to the lateral  epicondyle and its location in the spiral groove was palpated.   At this time the fracture site was visualized. There was a large butterfly  fragment medially as well as a single distal humeral fragment. The fracture  fragment edges were debrided of soft tissue. Periosteal elevation was  performed in order to visualize the posterolateral aspect of the distal  humerus as well as the medial aspect.   A single 3.5 cortical lag screw was placed across the large butterfly  fragment and it was anchored securely to the lateral cortex of  the distal  humerus. At this time the articular piece was reduced and provisionally held  with a K-wire. A 3.5 locking small frag plate was then placed on the  posterior  and lateral  cortex of the distal  humerus and 6 cortices of  fixation  was achieved in the distal fragment. Six cortices were also  achieved in the proximal  fragment. Two middle holes were left empty  secondary to the original traversing lag screw coming across 1 hole and the  fracture line itself coming through the 2nd hole.   At this time in order to achieve increased  fixation of  the butterfly  fragment to the distal humeral articular surface piece, a locking 1/3rd  tubular plate was fashioned and placed posteriorly with 1 locking screw  placed in the butterfly fragment and 1 locking screw placed in the distal  fragment.   At this time the hardware fixation was checked in the AP and lateral planes  under fluoroscopy. The tourniquet was released and bleeding points  encountered were controlled using bipolar electrocautery. The radial nerve  was at the tip of the plate but not under the plate.   After a thorough irrigation, 5 mL of bone bank bone was placed around the  fracture site. The incision was then closed over a Hemovac drain using 0  Vicryl figure-of-8 sutures to reapproximate in the tendinous portion and  interrupted inverted 2-0 Vicryl and skin staples to  reapproximate the skin  and subdermal tissue. The patient was placed in a posterior  splint. She was  then extubated and transferred to the recovery room in stable condition.                                               Burnard Bunting, M.D.    GSD/MEDQ  D:  05/07/2003  T:  05/08/2003  Job:  [id number]

## 2011-03-15 NOTE — Op Note (Signed)
NAMEIMAGINE, NEST NO.:  [id number]   MEDICAL RECORD NO.:  [id number]          PATIENT TYPE:  AMB   LOCATION:  ENDO                         FACILITY:  [facility]   PHYSICIAN:  Georgiana Spinner, M.D.    DATE OF BIRTH:  Mar 05, 1920   DATE OF PROCEDURE:  11/30/2004  DATE OF DISCHARGE:                                 OPERATIVE REPORT   PROCEDURE:  Upper endoscopy.   INDICATIONS:  Dysphagia.   ANESTHESIA:  Demerol 60, Versed 6 milligrams.   PROCEDURE:  With the patient mildly sedated in room 2 of radiology at [facility], the Olympus videoscopic endoscope was inserted in the mouth  and passed under direct vision through the esophagus which appeared normal  into the stomach. There were changes of the diffuse gastritis seen and  photographed that was seen in the body mostly the lesser so in the fundus  and antrum. Biopsies were taken. We advanced the endoscope through the of  pylorus and the duodenal bulb, second portion of the duodenum both of which  were photographed. From this point,  the endoscope was slowly withdrawn  taking circumferential views of duodenal mucosa until the endoscope had been  pulled back in the stomach,  placed in retroflexion to view the stomach from  below. The endoscope was then straightened and guidewire was passed. The  endoscope was withdrawn after biopsies of the gastric body and antrum and  fundus were taken.  Subsequently Savary dilators of 14 and16 were passed  under fluoroscopic control with the latter.  The guidewire was removed. The  endoscope was reinserted. Subsequently the guidewire was then repassed and  the scope withdrawn and a 17 Savary was passed with minimal resistance.  With the latter, the guidewire was removed. The patient's vital signs and  pulse oximeter remained stable. The patient tolerated procedure well without  apparent complications.   FINDINGS:  Changes of gastritis, rather diffuse biopsy.  Await biopsy  report. The patient will call me for results and follow-up with me as an  outpatient.  Dilation of the esophagus to 14, 16 and subsequently 17 Savary  dilation. Await clinical response.      GMO/MEDQ  D:  11/30/2004  T:  11/30/2004  Job:  [id number]

## 2011-03-15 NOTE — Op Note (Signed)
NAMEBRYSSA, TONES NO.:  [id number]   MEDICAL RECORD NO.:  [id number]          PATIENT TYPE:  AMB   LOCATION:  ENDO                         FACILITY:  [facility]   PHYSICIAN:  Georgiana Spinner, M.D.    DATE OF BIRTH:  August 19, 1920   DATE OF PROCEDURE:  12/11/2005  DATE OF DISCHARGE:                                 OPERATIVE REPORT   PROCEDURE:  Upper endoscopy with biopsy and Botox injection.   INDICATIONS:  Dysphagia.   ANESTHESIA:  1.  Demerol 60 mg.  2.  Versed 6 mg.   PROCEDURE:  With the patient mildly sedated in the left lateral decubitus  position, the Olympus videoscopic endoscope was inserted in the mouth and  passed under direct vision through the esophagus. Of note, this  cricopharyngeus felt a little tight in passage, but were able to advance the  scope distally. The distal esophagus also felt a little tight. As we entered  into the stomach, fundus, body, antrum, duodenal bulb, and second portion of  the duodenum were visualized. From this point, the endoscope was slowly  withdrawn, taking circumferential views of duodenal mucosa until the  endoscope had been pulled back into the stomach and placed on retroflexion  to view the stomach from below. The endoscope was then straightened and  withdrawn, taking circumferential views of the remaining gastric and  esophageal mucosa, stopping in the fundus to photograph some diffuse  erythematous changes that were biopsied, and also stopping next in the  distal esophagus, where we injected Botox 1 cc into four quadrants of the  area of the lower esophageal sphincter. The endoscope was then withdrawn.  The patient's vital signs and pulse oximeter remained stable. The patient  tolerated the procedure well without apparent complications.   FINDINGS:  Erythematous changes of the gastric fundus, biopsied, and distal  esophageal sphincter injected with Botox.   PLAN:  Await clinical response and biopsy report. The  patient will call me  for results and follow up with me as an outpatient.           ______________________________  Georgiana Spinner, M.D.     GMO/MEDQ  D:  12/11/2005  T:  12/11/2005  Job:  [id number]

## 2011-03-15 NOTE — Discharge Summary (Signed)
[facility]  Patient:    [name], [name] Visit Number: [id number] MRN: [id number]          Service Type: ECR Location: [facility] 4532 01 Attending Physician:  Herold Harms Dictated by:   Mcarthur Rossetti. Angiulli, P.A. Admit Date:  11/09/2001 Discharge Date: 11/12/2001   CC:         Dr. Ophelia Charter             Dr. Aleen Campi             Dr. Johnsie Kindred             Dr. Angeline Slim                           Discharge Summary  DISCHARGE DIAGNOSES: 1. Left acetabular fracture with superior inferior pubic rami fractures status    post traction with Steinman pin removed 1/13. 2. Left olecranon fracture with open reduction and internal fixation 12/21. 3. Subendocardial myocardial infarction status post cardiac catheterization    with PTCA 11/06/01. 4. Non-insulin-dependent diabetes mellitus. 5. Rheumatoid arthritis. 6. Hypertension. 7. Gastroesophageal reflux disease.  HISTORY OF PRESENT ILLNESS:  This is a 77 year old white female admitted 12/20 with a history of rheumatoid arthritis and diabetes mellitus who presented after a fall while stepping into her home landing on her left elbow and hip. Upon evaluation, x-ray showed left acetabular fracture, left displaced olecranon fracture, left superior inferior pubic rami fracture.  Underwent ORIF of the left elbow with pin and K-wire placed in the distal femur and traction applied 12/21 per Dr. Ophelia Charter.  On 12/23, with anemia 8.0 transfused. Traction ongoing to left lower extremity.  On 12/24, with increased shortness of breath, oxygen saturation was 70 percent on room air.  Noted history of intermittent chest pain with plan for stress test to be completed in 1/03. Cardiology followup from [facility] 12/24.  Cardiac enzymes were positive CK-MBs, troponin 1/7.17.  Chest x-ray showed pulmonary edema felt to be subendocardial myocardial infarction.  Placed on aspirin and Plavix therapy.  Hold on any  invasive procedures planned until after traction had been removed.  Echocardiogram with ejection fraction 50-55 percent with left ventricular systolic function, lower limit of normal.  She was on subcutaneous Lovenox for deep vein thrombosis prophylaxis.  She continued on aspirin and Plavix for her history of myocardial infarction.  On 11/06/01 with cardiac catheterization per Dr. Aleen Campi and tolerated the procedure well. The patient had removal of Steinman traction pin 1/13 by orthopedic services. Advised touchdown weightbearing left lower extremity using a platform walker for the left upper extremity.  Latest chemistries unremarkable.  Blood sugars: 103, 94, 120, and 107.  PAST MEDICAL HISTORY:  See discharge diagnoses.  PAST SURGICAL HISTORY: 1. Left bunionectomy. 2. D&C. 3. Oophorectomy.  PRIMARY PHYSICIAN:  Dr. Johnsie Kindred.  ALLERGIES:  DEMEROL, SULFA, CODEINE, PENICILLIN, AND CELEBREX.  MEDICATIONS PRIOR TO ADMISSION:  Plaquenil, Elavil, Nexium, Darvocet, Glucophage, Ativan, Hydrochlorothiazide, Salicylate.  SOCIAL HISTORY:  Lives alone in [place].  Independent prior to admission. One level home with steps to entry.  Local daughter works.  HOSPITAL COURSE:  Patient with progressive gains while on rehab services with therapies initiated on a daily basis.  The following issues are followed during patients rehab course.  Pertaining to Ms. Schenks left acetabular fracture with superior inferior pubic rami fractures, her Steinman pin had been removed.  She was 25 percent partial weightbearing followed by Dr. Ophelia Charter.  Neurovascular sensation remained intact.  She was using a platform walker for the left upper extremity for left olecranon fracture with ORIF 12/21.  She was on subcutaneous Lovenox for deep vein thrombosis prophylaxis.  Venous Doppler studies prior to her discharge were negative.  Cardiac status remained stable for subendocardial myocardial infarction.  She was on  aspirin and Plavix therapy per [facility].  Blood sugars remained controlled without the use of Glucophage.  Blood sugars had since been discontinued.  She would follow up with her primary doctor for consideration of resuming this medication.  She would remain on Plaquenil for her rheumatoid arthritis.  She would follow up with Dr. Ashley Akin.  Blood pressures remained controlled with regimen of Altace 2.5 mg daily, hydrochlorothiazide 12.5 mg daily, and Toprol 25 mg daily.  She had no chest pain or shortness of breath throughout her rehab course.  She continued on her Protonix for gastroesophageal reflux disease.  She had no bowel or bladder disturbances. Overall for her functional mobility, she had supervision bed mobility using a hand rail, minimal assistance for the sit to standing position.  Supervision wheelchair level using a platform walker for short distance ambulation.  PLAN:  Home health physical and occupational therapy.  She will follow up with Dr. Ophelia Charter of orthopedic services.  Latest labs showed a hemoglobin of 11.1, hematocrit 32.8, sodium of 142, potassium 3.6, BUN 9, creatinine of 0.8.  DISCHARGE MEDICATIONS:  1. Aspirin 81 mg daily.  2. Plavix 75 mg daily.  3. Altace 2.5 mg daily.  4. Zocor 10 mg at bedtime.  5. Hydrochlorothiazide 12.5 mg daily.  6. Elavil 25 mg at bedtime.  7. Salsitab 500 mg, 3 tablets twice daily.  8. Plaquenil 200 mg twice daily.  9. Protonix 80 mg twice daily. 10. Toprol XL 25 mg daily. 11. Ativan 2 mg at bedtime as needed. 12. Darvocet as needed for pain.  ACTIVITY:  Twenty-five percent weightbearing to the left lower extremity. Platform walker left upper extremity.  DIET:  An 1800-calorie ADA low salt.  SPECIAL INSTRUCTIONS:  Home health physical and occupational therapy.  She should follow up with Dr. Annell Greening of orthopedic services.  Call for an  appointment, Dr. Aleen Campi of cardiology services, and Dr.  Johnsie Kindred of medical management. Dictated by:   Mcarthur Rossetti. Angiulli, P.A. Attending Physician:  Herold Harms DD:  12/01/01 TD:  12/01/01 Job: [id number]

## 2011-03-15 NOTE — H&P (Signed)
[facility]  Patient:    [name], POEHLER Visit Number: [id number] MRN: [id number]          Service Type: Attending:  Veverly Fells. Ophelia Charter, M.D. Dictated by:   Genene Churn. Barry Dienes, P.A.C.                           History and Physical  CHIEF COMPLAINT:  Left elbow pain and left hip pain.  HISTORY OF PRESENT ILLNESS:  [age]-year-old white female with a history of rheumatoid arthritis who presents to the emergency room for left elbow and left hip pain.  The patient states that she was walking into her home earlier this afternoon when she reached for her side door, tripping, landing on the concrete, hitting her left elbow and left hip area.  She said that she was on the ground for 1-1/2 hours until someone heard her calling for help.  States that she was unable to get up and stand and put weight on her left leg.  She has increased pain in her left elbow and left hip area with minimal numbness and tingling at times going down the left lower extremity.  No previous history of elbow or hip fractures.  X-rays in the emergency room showed a left acetabular fracture, left displaced olecranon fracture, and left superior inferior pubic ramus fracture.  CURRENT MEDICATIONS: 1. Plaquenil 200 mg p.o. b.i.d. 2. Amitriptyline 25 mg p.o. q.d. 3. Nexium 40 mg p.o. b.i.d. 4. Darvocet-N 100 1 tablet q.4-6h. p.r.n. 5. Glucophage 500 mg p.o. q.d. 6. Ativan 2.0 mg p.o. q.h.s. 7. Promethazine 25 mg, 1/2 tablet t.i.d. 8. Hydrochlorothiazide 25 mg p.o. q.d. 9. Salicylate 750 mg, 2 tablets p.o. b.i.d.  ALLERGIES:  No known drug allergies.  PAST MEDICAL HISTORY: 1. GERD. 2. Type 2 diabetes mellitus. 3. Arthritis. 4. Hypertension. 5. Esophageal spasm. 6. Rheumatoid arthritis.  FAMILY HISTORY:  Noncontributory.  SOCIAL HISTORY:  The patient is retired.  Denies smoking or alcohol consumption.  REVIEW OF SYSTEMS:  The patient currently denies any chest pain, shortness  of breath.  All systems are noncontributory.  PHYSICAL EXAMINATION:  VITAL SIGNS:  Temperature 94.2, pulse 104, respirations 20, blood pressure 107/41.  GENERAL:  Pleasant, elderly white female who is alert and oriented x 3, in no acute distress.  HEENT:  Head is normocephalic, atraumatic.  EOMI.  Oral mucosa is pink and moist.  NECK:  Good range of motion.  Supple.  Nontender.  Carotid pulses are palpable and intact.  No lymphadenopathy.  LUNGS:  CTA bilaterally.  No wheezes, rubs, or rhonchi.  BREASTS:  Not examined.  HEART:  RRR.  Normal S1, S2.  No murmurs, rubs, or gallops noted.  No carotid bruits.  ABDOMEN:  Round.  Nondistended.  Normoactive bowel sounds all four quadrants. Soft, nontender.  No palpable masses or organomegaly.  EXTREMITIES:  On examination of the left elbow it is markedly swollen, and she is with ecchymosis.  She had a decreased range of motion.  Her radial pulses are intact.  Sensory and motor function are intact.  On examination of the left hip, she has increased pain with logrolling.  Pedal pulses are weak bilaterally.  Lower extremity sensory and motor function intact.  SKIN:  Warm and dry.  LABORATORY DATA:  X-rays in the emergency room showed a left acetabular fracture, left displaced olecranon fracture, left superior inferior pubic ramus fracture.  Sodium 138, potassium 4.3, chloride 103, carbon dioxide 28, glucose 145,  BUN 15, creatinine 1.1.  PT 14.5, INR 1.2.  WBC 13.3, hemoglobin 13.3, hematocrit 38.1, platelets 184.  PTT 30.  ASSESSMENT: 1. Left olecranon fracture. 2. Left acetabular fracture. 3. Left superior inferior pubic ramus fracture. 4. History of rheumatoid arthritis.  PLAN:  Will admit the patient to the orthopedic floor.  Will start her on IV pain medications.  Will order a CT scan of the pelvis.  Will apply posterior splint to the left elbow.  Will start her on balanced traction of the left lower extremity on arrival  to the floor.  Will have the patient and her family sign an operative permit for an ORIF for left olecranon fracture and insertion of distal femoral traction pin.  Will plan to do this surgery on October 18, 2001. Dictated by:   Genene Churn. Barry Dienes, P.A.C. Attending:  Veverly Fells. Ophelia Charter, M.D. DD:  10/16/01 TD:  10/17/01 Job: [id number]

## 2011-03-15 NOTE — Op Note (Signed)
[place]  Patient:    [name], [name] Visit Number: [id number] MRN: [id number]          Service Type: SUR Location: 4W [id number] [id number] Attending Physician:  Jacki Cones Dictated by:   Veverly Fells Ophelia Charter, M.D. Proc. Date: 10/18/01 Admit Date:  10/16/2001                             Operative Report  PREOPERATIVE DIAGNOSIS:  Left olecranon fracture and left central acetabular fracture.  POSTOPERATIVE DIAGNOSIS:  Left olecranon fracture and left central acetabular fracture.  OPERATION PERFORMED:  Open reduction internal fixation, tension band wiring, left olecranon fracture and left distal femoral traction pin placement.  SURGEON:  Mark C. Ophelia Charter, M.D.  ASSISTANT:  Zonia Kief, PA-C  ANESTHESIA:  General orotracheal.  ESTIMATED BLOOD LOSS:  Less than 100 ml.  TOURNIQUET TIME:  15 minutes.  DESCRIPTION OF PROCEDURE:  After induction of general anesthesia, orotracheal intubation which was extremely difficult, the patient was transferred to the operating room and a proximal arm tourniquet was applied.  The patient had good saturations throughout the difficult intubation.  Left upper extremity was prepped with DuraPrep, preoperative Ancef was given prophylactically. Sterile skin marker was used.  Extremity sheets and drapes, Esmarch wrapping and tourniquet inflation at 250 mHg pressure.  Incision was made.  Fracture was evacuated of its hematoma with irrigation, subperiosteal dissection down the ulna was performed.  The olecranon was grasped.  There was some comminution to it with the outer cortical layer being a separate piece from the cancellous bone.  A drill hole was made through the cancellous bone and then down the shaft and finger pressure was used on the cortical piece to keep it in satisfactory position.  An 85 mm cancellous screw was placed down the shaft of the ulna using a washer and then the figure-of-eight 20 gauge wire was passed  through a small drill hole bicortical and looped in a figure-of-eight holding down the olecranon cortical piece.  The entire apparatus was checked under fluoroscopy.  The screw was advanced slightly, the wire tightened down further and the elbow was taken through flexion and extension with good reduction and good reduction of the elbow joint.  After irrigation with saline solution, the wire was cut and bent over until it was not prominent, subcutaneous tissue reapproximated with 2-0 Vicryl and skin staple closure, Xeroform, 4 x 4s, ABD, Webril and long arm splint and Ace wrap.  Next, Betadine was sprayed on the distal femur medial and lateral. Sterile towels were placed, the leg was internally rotated, to lateralize the cortex.  A small stab incision was made and a sterile pin with drill was used to place a distal femoral pin perpendicular to the axis of the femur.  The patient was then transferred into a regular orthopedic bed with the overhead trapeze and traction apparatus which was adjusted and placed in 20 pounds distal femoral skeletal traction.  The patient tolerated the procedure well and was transferred to the recovery room in stable condition. Dictated by:   Veverly Fells Ophelia Charter, M.D. Attending Physician:  Jacki Cones DD:  10/18/01 TD:  10/19/01 Job: [id number]

## 2011-03-15 NOTE — Op Note (Signed)
NAMEWELMA, [name]                          ACCOUNT NO.:  [id number]   MEDICAL RECORD NO.:  [id number]                   PATIENT TYPE:  AMB   LOCATION:  ENDO                                 FACILITY:  [facility]   PHYSICIAN:  Georgiana Spinner, M.D.                 DATE OF BIRTH:  Aug 05, 1920   DATE OF PROCEDURE:  DATE OF DISCHARGE:                                 OPERATIVE REPORT   PROCEDURE:  Upper endoscopy with dilation and biopsy.   INDICATIONS FOR PROCEDURE:  Dysphagia.   ANESTHESIA:  Fentanyl 62.5 mcg, Versed 8 mg.   DESCRIPTION OF PROCEDURE:  With the patient mildly sedated in the left  lateral decubitus position, the Olympus videoscopic endoscope was inserted  in the mouth and passed under direct vision through the esophagus which  appeared grossly normal  into the stomach and it was noted that there was  small amounts of blood flecks in the stomach. Also there was some degree of  gastroparesis with some retained food.  We advanced to the antrum which  appeared inflamed and was biopsied along with the body of the stomach.  The  duodenal bulb and second portion of the duodenum were visualized and  appeared normal.  From this point, the endoscope was slowly withdrawn taking  circumferential views of the duodenal mucosa until the endoscope was then  pulled back in the stomach, placed in retroflexion to view the stomach from  below. The endoscope was then straightened and a guidewire was passed in the  antrum, the endoscope was removed.  Subsequently Savary dilators of 14 and  17 were passed over the guidewire rather easily.  The endoscope was then  reinserted after the guidewire was removed, advanced into the stomach and  withdrawn taking circumferential views of the remaining gastric and  esophageal mucosa. The patient's vital signs and pulse oximeter remained  stable. The patient tolerated the procedure well without apparent  complications.   FINDINGS:  Some degree of  gastroparesis with inflammatory changes in the  stomach biopsied.  The patient's esophagus dilated to 14 and 17 Savary.   PLAN:  Await clinical response to dilation and biopsy.  The patient will  call me for results and followup with me as an outpatient.                                               Georgiana Spinner, M.D.    GMO/MEDQ  D:  01/06/2004  T:  01/06/2004  Job:  [id number]

## 2011-07-25 IMAGING — CT CT HEAD W/O CM
2 series · 16 of 30 positions shown, 20 images · non-contrast
Comparison: none

[Series 2: head wo · axial · 0.49mm/px · z∈[+160,+287]mm · 13 of 30 slices shown, 17 images]
[im 3/30  brain]
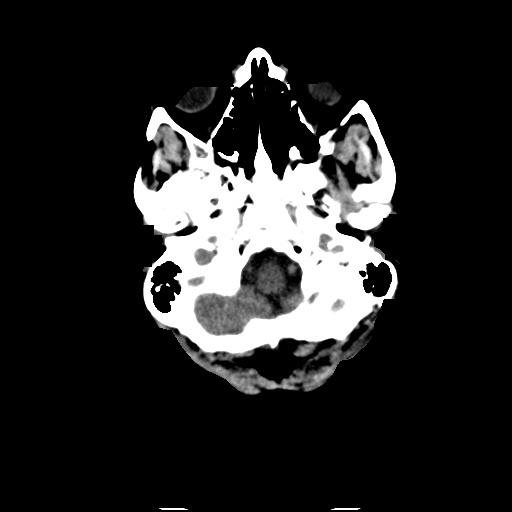
[im 3/30  bone]
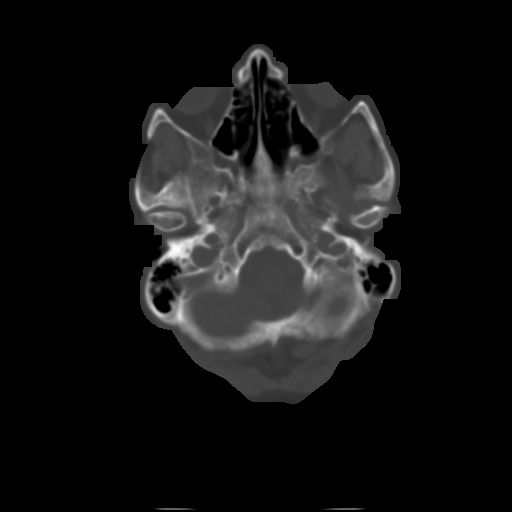
[im 5/30  brain]
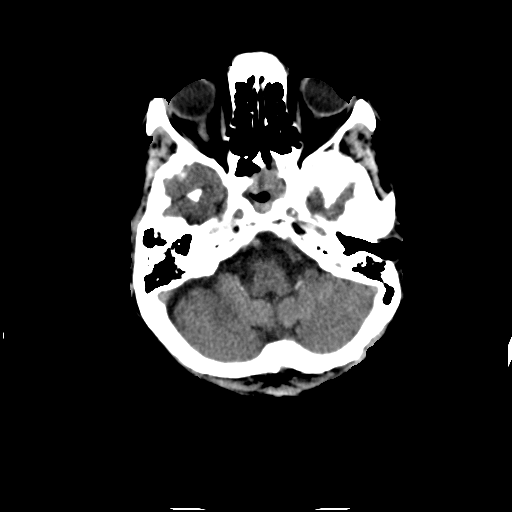
[im 7/30  brain]
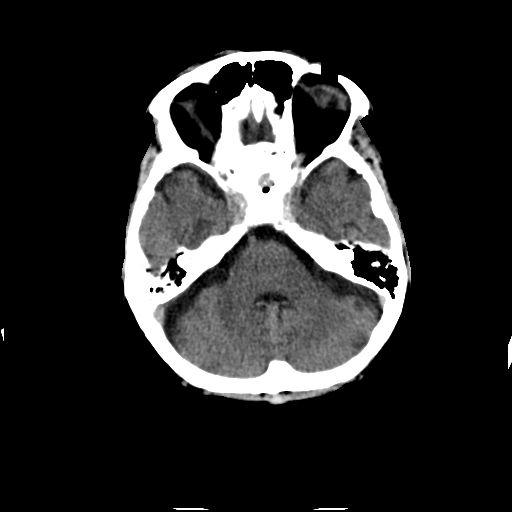
[im 9/30  brain]
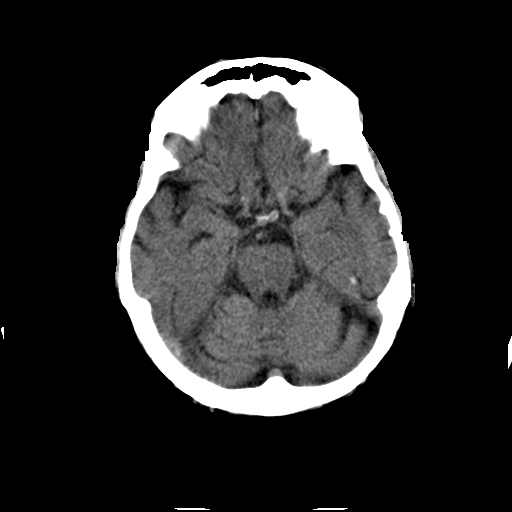
[im 11/30  brain]
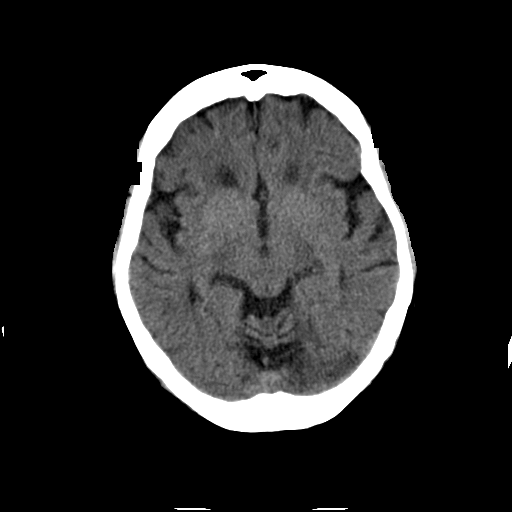
[im 11/30  bone]
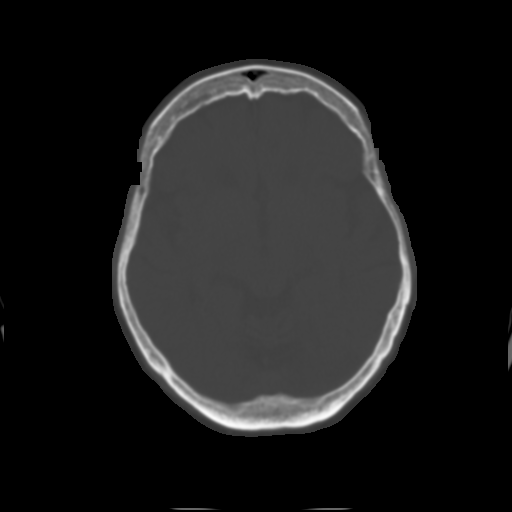
[im 13/30  brain]
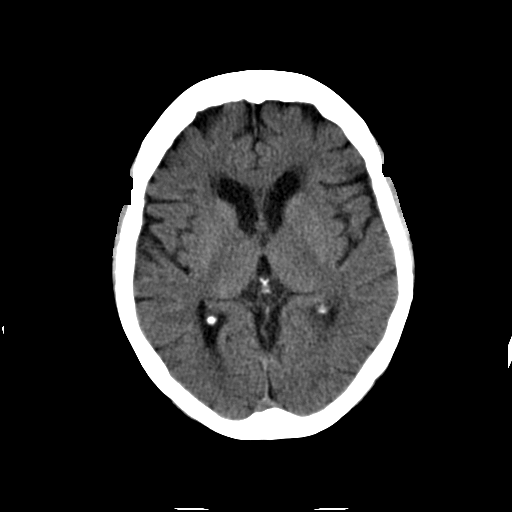
[im 15/30  brain]
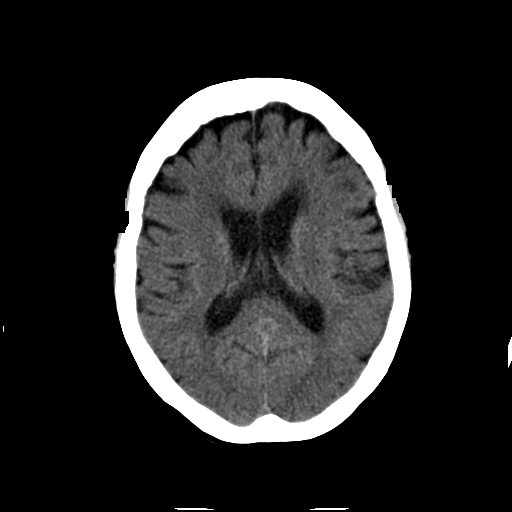
[im 17/30  brain]
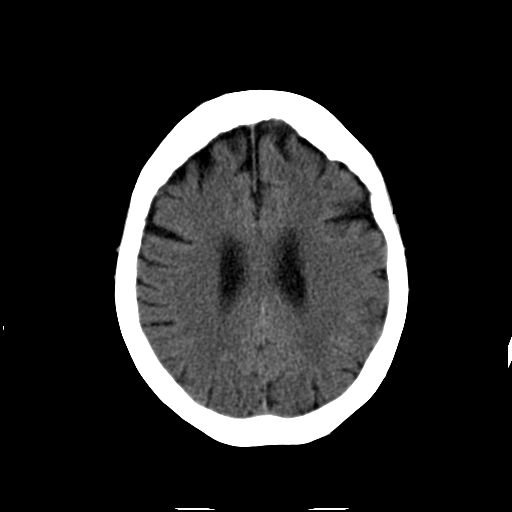
[im 19/30  brain]
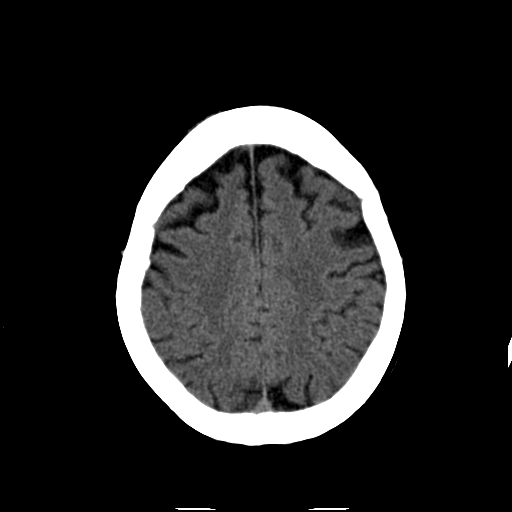
[im 19/30  bone]
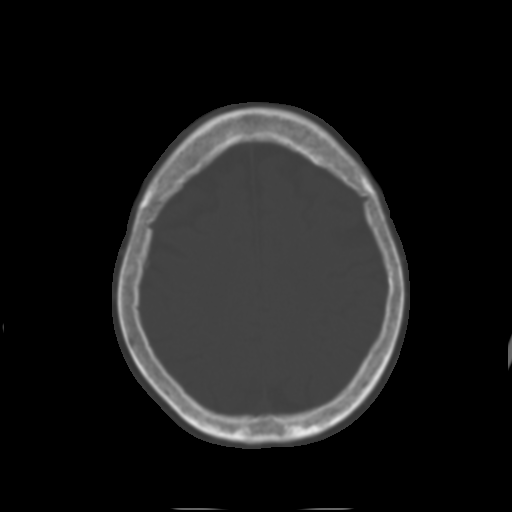
[im 21/30  brain]
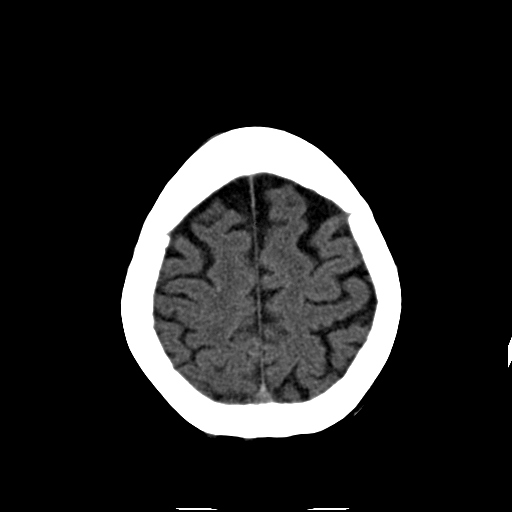
[im 23/30  brain]
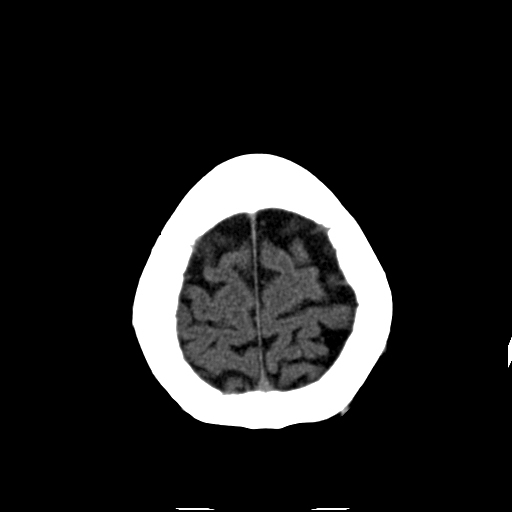
[im 25/30  brain]
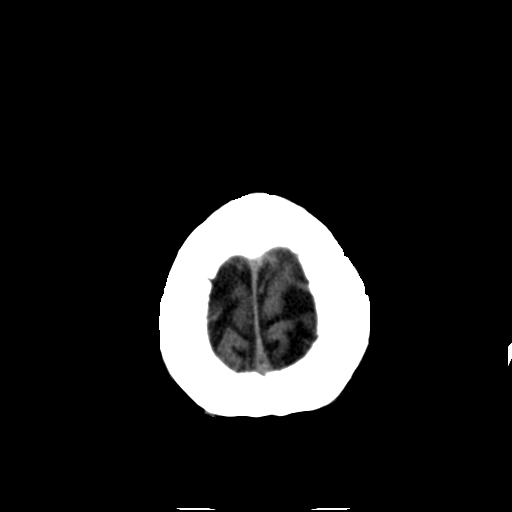
[im 27/30  brain]
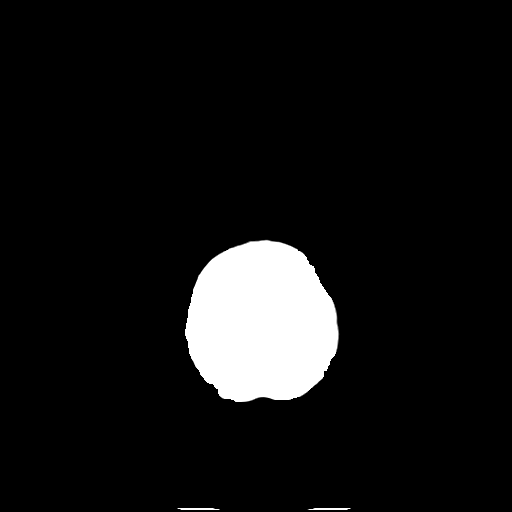
[im 27/30  bone]
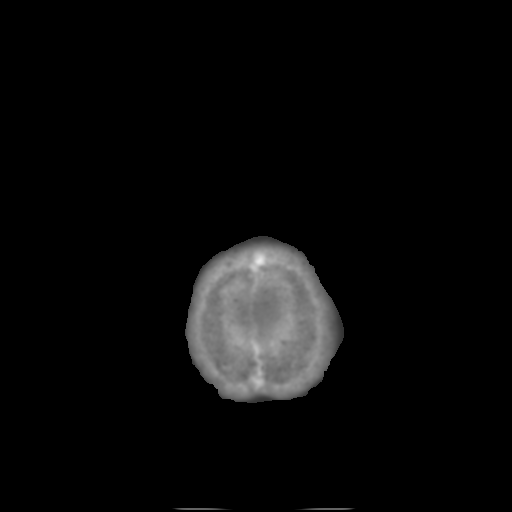

[Series 3: head bone · axial · 0.49mm/px · z∈[+160,+203]mm · 3 of 30 slices shown]
[im 3/30  bone]
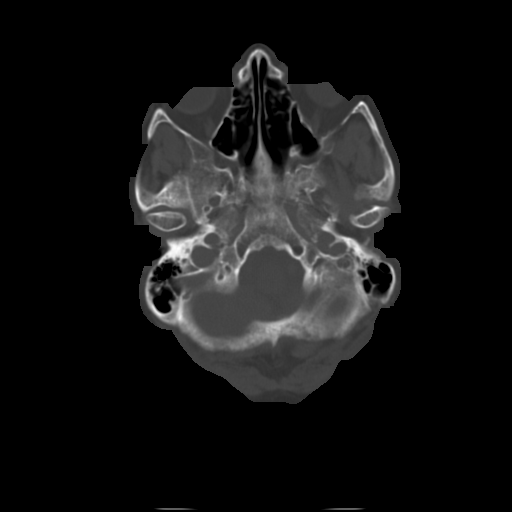
[im 7/30  bone]
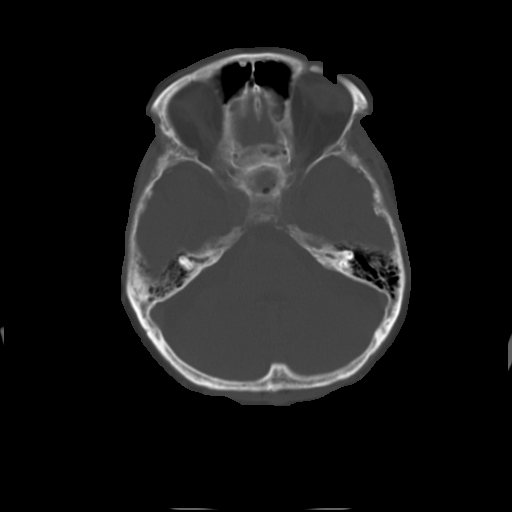
[im 11/30  bone]
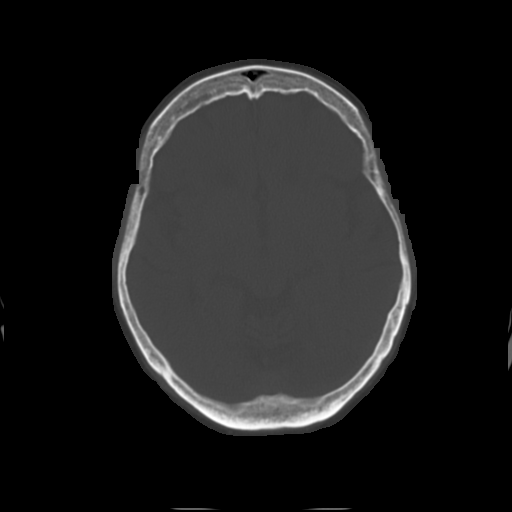

[16 of 30 positions shown; findings below may reference images not displayed]

This examination was performed at [HOSPITAL] at 0137 [REDACTED].  The interpretation will be provided by [REDACTED].

## 2011-07-26 LAB — CBC
HCT: 38.1
Hemoglobin: 14.1
Hemoglobin: 13
MCHC: 33.8
MCV: 96.7
MCV: 97.3
RBC: 3.89
RBC: 4.3
RBC: 3.93
RDW: 14.5
WBC: 4.8
WBC: 5

## 2011-07-26 LAB — BASIC METABOLIC PANEL
Chloride: 116 — ABNORMAL HIGH
Creatinine, Ser: 0.88
GFR calc Af Amer: >60
GFR calc Af Amer: >60
GFR calc non Af Amer: >60
Glucose, Bld: 71
Potassium: 3.1 — ABNORMAL LOW
Sodium: 144

## 2011-07-26 LAB — HEMOGLOBIN A1C
Hgb A1c MFr Bld: 6.2 — ABNORMAL HIGH
Mean Plasma Glucose: 143

## 2011-07-26 LAB — HEPATIC FUNCTION PANEL: Total Protein: 6.1

## 2011-07-26 LAB — DIFFERENTIAL
Basophils Absolute: 0
Basophils Relative: 0
Eosinophils Absolute: 0.1
Monocytes Absolute: 0.2
Monocytes Relative: 5
Neutrophils Relative %: 78 — ABNORMAL HIGH

## 2011-07-26 LAB — URINALYSIS, ROUTINE W REFLEX MICROSCOPIC
Glucose, UA: NEGATIVE
Protein, ur: NEGATIVE
pH: 5

## 2011-07-26 LAB — POCT I-STAT, CHEM 8
BUN: 18
Chloride: 116 — ABNORMAL HIGH
Potassium: 3.5
Sodium: 143
TCO2: 23

## 2013-04-27 DEATH — deceased

## 2014-07-29 ENCOUNTER — Telehealth: Payer: Self-pay | Admitting: Cardiovascular Disease

## 2014-07-29 ENCOUNTER — Encounter: Payer: Self-pay | Admitting: Cardiovascular Disease

## 2014-07-29 NOTE — Telephone Encounter (Signed)
07-29-14 SENT PAST DUE LETTER FOR PACEMAKER CHECK, FORMER [facility] PT NEEDS TO RE-EST WITH ANY EP/MT
